# Patient Record
Sex: Female | Born: 1970 | Hispanic: No | Marital: Married | Smoking: Never smoker
Health system: Southern US, Community
[De-identification: ages and names within clinical notes are randomized; demographics above are authoritative.]

## PROBLEM LIST (undated history)

## (undated) DIAGNOSIS — Z9889 Other specified postprocedural states: Secondary | ICD-10-CM

## (undated) DIAGNOSIS — J45909 Unspecified asthma, uncomplicated: Secondary | ICD-10-CM

## (undated) DIAGNOSIS — R053 Chronic cough: Secondary | ICD-10-CM

## (undated) DIAGNOSIS — K219 Gastro-esophageal reflux disease without esophagitis: Secondary | ICD-10-CM

## (undated) DIAGNOSIS — N200 Calculus of kidney: Secondary | ICD-10-CM

## (undated) DIAGNOSIS — R05 Cough: Secondary | ICD-10-CM

## (undated) DIAGNOSIS — Z8719 Personal history of other diseases of the digestive system: Secondary | ICD-10-CM

## (undated) DIAGNOSIS — M797 Fibromyalgia: Secondary | ICD-10-CM

## (undated) DIAGNOSIS — D649 Anemia, unspecified: Secondary | ICD-10-CM

## (undated) HISTORY — PX: HERNIA REPAIR: SHX51

---

## 1998-08-13 ENCOUNTER — Other Ambulatory Visit: Admission: RE | Admit: 1998-08-13 | Discharge: 1998-08-13 | Payer: Self-pay | Admitting: Obstetrics & Gynecology

## 2000-01-10 ENCOUNTER — Inpatient Hospital Stay (HOSPITAL_COMMUNITY): Admission: AD | Admit: 2000-01-10 | Discharge: 2000-01-13 | Payer: Self-pay | Admitting: Obstetrics and Gynecology

## 2000-01-14 ENCOUNTER — Encounter: Admission: RE | Admit: 2000-01-14 | Discharge: 2000-04-13 | Payer: Self-pay | Admitting: Obstetrics & Gynecology

## 2000-02-10 ENCOUNTER — Other Ambulatory Visit: Admission: RE | Admit: 2000-02-10 | Discharge: 2000-02-10 | Payer: Self-pay | Admitting: Obstetrics and Gynecology

## 2002-03-15 ENCOUNTER — Other Ambulatory Visit: Admission: RE | Admit: 2002-03-15 | Discharge: 2002-03-15 | Payer: Self-pay | Admitting: Obstetrics & Gynecology

## 2002-06-05 ENCOUNTER — Inpatient Hospital Stay (HOSPITAL_COMMUNITY): Admission: AD | Admit: 2002-06-05 | Discharge: 2002-06-08 | Payer: Self-pay | Admitting: Obstetrics and Gynecology

## 2004-12-12 ENCOUNTER — Emergency Department (HOSPITAL_COMMUNITY): Admission: EM | Admit: 2004-12-12 | Discharge: 2004-12-12 | Payer: Self-pay | Admitting: Emergency Medicine

## 2004-12-15 ENCOUNTER — Other Ambulatory Visit: Admission: RE | Admit: 2004-12-15 | Discharge: 2004-12-15 | Payer: Self-pay | Admitting: Obstetrics & Gynecology

## 2009-06-16 ENCOUNTER — Encounter: Admission: RE | Admit: 2009-06-16 | Discharge: 2009-06-16 | Payer: Self-pay | Admitting: Obstetrics & Gynecology

## 2011-04-08 NOTE — Op Note (Signed)
[facility]  Patient:    Diamond Bennett, Diamond Bennett                    MRN: [id number] Proc. Date: 01/10/00 Adm. Date:  [id number] Attending:  Marcelle Overlie                           Operative Report  PREOPERATIVE DIAGNOSIS:       Intrauterine pregnancy at term.  Previous cesarean section, refuses vaginal birth after cesarean section.  POSTOPERATIVE DIAGNOSIS:      Intrauterine pregnancy at term.  Previous cesarean section, refuses vaginal birth after cesarean section.  OPERATION:                    Repeat low transverse cesarean section.  SURGEON:                      Marcelle Overlie, M.D.  ASSISTANT:                    Gerrit Friends. Aldona Bar, M.D.  ANESTHESIA:                   Spinal.  ESTIMATED BLOOD LOSS:         500 cc.  FINDINGS:                     Vigorous female in cephalic presentation with Apgars of 8 at one minute and 9 at five minutes weighing 8 pounds.  Normal adnexa.  DESCRIPTION OF PROCEDURE:     The patient was taken to the operating room.  A spinal was then placed without difficulty.  The abdomen and vulvar were then prepped and draped in the usual sterile fashion.  Foley catheter was placed in he bladder.  Using the scalpel, a low transverse incision was made and carried down to the fascia.  Using electrocautery with hemostasis the fascia was scored in the midline and extended laterally using Mayo scissors.  The Kocher clamps were then used to lift up on the fascia and a Pfannenstiel incision was created by dissecting the rectus muscles away superiorly and then inferiorly.  The midline was then separated.  The peritoneum was then entered bluntly and the peritoneal incision was extended superiorly and then inferiorly with good visualization of the bladder.  The bladder blade was inserted and the lower uterine segment was identified. The bladder flap was then created sharply and digitally.  The bladder blade was then readjusted.   A low transverse incision was made in the uterus and then extended  laterally.  The amniotic fluid was noted to be clear upon entry into the uterus. The infant was in cephalic presentation and was delivered via assistance with the vacuum extractor and was a vigorous female with Apgars of 8 at one minute and 9 at five minutes weighing 8 pounds.  The cord was clamped and cut.  The baby was handed to the awaiting pediatricians and taken to the newborn nursery.  Cord blood was  obtained.  The placenta was manually removed and noted to be intact.  The uterus was cleared of all clots and debris.  The uterine incision was closed in a single layer using 0 chromic in a continuous running locked stitch.  The uterine incision was noted to be hemostatic.  The peritoneum was closed using 0 Vicryl in a continuous running stitch and the rectus  muscles were reapproximated using the ame 0 Vicryl.  After irrigation of the rectus muscles and noting hemostasis, the fascia was closed using 0 Vicryl starting at each corner and meeting in the midline in a continuous running stitch.  After irrigation of the subcutaneous tissue, the skin was closed with staples.  All sponge, needle, and instrument counts were correct x 2.  The patient tolerated the procedure well and went to the recovery room in stable condition. DD:  01/10/00 TD:  01/10/00 Job: [id number]

## 2011-04-08 NOTE — Op Note (Signed)
[facility]  Patient:    Diamond Bennett, Diamond Bennett Visit Number: [id number] MRN: [id number]          Service Type: OBS Location: [facility] Attending Physician:  Mickle Mallory Dictated by:   Janeece Riggers Dareen Piano, M.D. Proc. Date: 06/05/02                             Operative Report  PREOPERATIVE DIAGNOSES:       1. Intrauterine pregnancy at term.                               2. History of prior cesarean section x2.                               3. The patient refuses attempt at vaginal birth                                  after cesarean section.                               4. Small umbilical hernia.  POSTOPERATIVE DIAGNOSES:      1. Intrauterine pregnancy at term.                               2. History of prior cesarean section x2.                               3. The patient refuses attempt at vaginal birth                                  after cesarean section.                               4. Small umbilical hernia.  PROCEDURE:                    1. Repeat low transverse cesarean section.                               2. Repair of umbilical hernia.  SURGEON:                      Mark E. Dareen Piano, M.D.  ANESTHESIA:                   Spinal.  ANTIBIOTICS:                  Ancef 1 g.  DRAINS:                       Foley to bed side drainage.  ESTIMATED BLOOD LOSS:         900 cc.  SPECIMENS:                    None.  COMPLICATIONS:  None.  FINDINGS:                     The patient had normal appearing fallopian tubes and ovaries.  Placenta appeared to be normal.  Uterine cavity was normal.  The uterus was normal.  There was no evidence of adhesions or endometriosis.  The patient had a 1 cm umbilical hernia on the superior margin of the umbilicus.  PROCEDURE:                    The patient was taken to the operating room where spinal anesthetic was administered without complications.  She was then placed in the dorsal  supine position with a left lateral tilt.  The patient was prepped with Hibiclens and a Foley catheter was placed in her bladder. She was then draped in the usual fashion for this procedure.  A Pfannenstiel incision was made through the previous scar.  This was carried down to the fascia.  The fascia was entered in the midline, extended laterally with the Mayo scissors.  The rectus muscles were then dissected from the fascia with the Bovie.  Rectus muscles were divided in the midline and taken superiorly and inferiorly.  The parietoperitoneum was entered sharply and taken superiorly and inferiorly.  The bladder flap was taken down sharply.  A low transverse uterine incision was made in the midline and extended laterally with blunt dissection.  Amniotic sac was entered sharply.  Amniotic fluid was noted to be clear.  The infant was delivered in a vertex presentation.  On del of the head the oropharynx and nostrils were bulb suctioned.  The remaining infant was then delivered.  The cord was doubly clamped and cut and the infant handed to the NICU team.  Placenta was then manually removed.  The uterus exteriorized.  The uterine cavity was wiped with a wet lap.  The uterine incision was closed in a single layer of 0 chromic in a running locking fashion.  Bladder flap was not repaired.  The uterus was placed back in the abdominal cavity and the incision again checked and felt to be hemostatic. Next, the parietoperitoneum on the anterior abdominal wall was dissected and the umbilical hernia discovered.  Two interrupted 0 Monocryl sutures were placed in this area closing the defect.  At this point parietoperitoneum and rectus muscles were reapproximated in the midline using 3-0 chromic.  Fascia was closed using 0 Monocryl suture in a running fashion.  Subcuticular tissue was freed of scar tissue with the Bovie and made hemostatic with the Bovie. Stainless steel clips were used to close the skin.   The patient tolerated the procedure well.  She was taken to the recovery room in stable condition. Instrument and lap counts were correct x2. Dictated by:   Janeece Riggers Dareen Piano, M.D. Attending Physician:  Mickle Mallory DD:  06/05/02 TD:  06/07/02 Job: [id number]

## 2011-04-08 NOTE — Discharge Summary (Signed)
[facility]  Patient:    Diamond Bennett, Diamond Bennett Visit Number: [id number] MRN: [id number]          Service Type: OBS Location: 910A 9130 01 Attending Physician:  Osborn Coho Dictated by:   Caralyn Guile Arlyce Dice, M.D. Admit Date:  06/05/2002 Discharge Date: 06/08/2002                             Discharge Summary  DISCHARGE DIAGNOSES:          1. Term pregnancy, delivered.                               2. Previous cesarean section.                               3. Umbilical hernia.  SECONDARY DIAGNOSES:          Repeat low transverse cesarean section and repair of umbilical hernia.  COMPLICATIONS:                None.  CONDITION:                    Condition on discharge was improved.  HISTORY OF PRESENT ILLNESS:   This is a 40 year old gravida 3, para 2, with an estimated date for confinement of June 14, 2002, who was admitted at 38-1/2 weeks for a repeat cesarean section and repair of umbilical hernia.  HOSPITAL COURSE:              The patient was brought to the operating room by Dr. Dareen Piano on the day of admission where a repeat low transverse cesarean section and repair of umbilical hernia was performed without complications. Operative course was benign without significant fever or anemia.  The patient delivered a female infant, 7 pounds 5 ounces.  Apgar scores were 9 and 9. There were no complications.  The patients postoperative course was benign without significant fever or anemia.  On the third postoperative day, the patient was felt to be ready for discharge.  She was discharged on a regular diet, told to limit her activity.  She was given Tylox 30 tablets to take one to two every four hours for pain.  She was asked to take her prenatal vitamins while she was nursing and to return to the office in four weeks for followup evaluation.  LABORATORY DATA:              Her admission hemoglobin was 9.4 with a white count of 6300.   Postoperatively, her hemoglobin was 8.2 with a white count of 7400.  Urinalysis was benign. Dictated by:   Caralyn Guile Arlyce Dice, M.D. Attending Physician:  Osborn Coho DD:  06/08/02 TD:  06/14/02 Job: [id number]

## 2011-04-08 NOTE — Discharge Summary (Signed)
[facility]  Patient:    Diamond Bennett, Diamond Bennett                    MRN: [id number] Adm. Date:  [id number] Disc. Date: [id number] Attending:  Marcelle Overlie Dictator:   Leilani Able, P.A.                           Discharge Summary  FINAL DIAGNOSES:              1. Intrauterine pregnancy at term.                               2. Previous cesarean section and refuses vaginal                                  birth after cesarean section.  PROCEDURE:                    Repeat low transverse cesarean section.  SURGEON:                      Dr. Marcelle Overlie.  ASSISTANT:                    Dr. Melvia Heaps.  COMPLICATIONS:                None.  HOSPITAL COURSE:              This G2 P1 was taken to the operating room on January 10, 2000 by Dr. Marcelle Overlie for a repeat low transverse cesarean section.  Patient had the delivery of an 8 pound female infant with Apgars of 8  and 9.  Delivery without complications.  Patients course was benign without significant fevers.  Patient was felt ready for discharge on postoperative day 3.  DIET:                         Patient was sent home on a regular diet.  ACTIVITY:                     Told to decrease activities.  MEDICATIONS:                  Told to continue prenatal vitamins, was given Tylox 1-2 q.4h. as needed for pain.  FOLLOW-UP:                    Was told to follow up in the office in four weeks. DD:  01/26/00 TD:  01/27/00 Job: [id number]

## 2014-08-17 DIAGNOSIS — M533 Sacrococcygeal disorders, not elsewhere classified: Secondary | ICD-10-CM | POA: Insufficient documentation

## 2014-08-17 DIAGNOSIS — D649 Anemia, unspecified: Secondary | ICD-10-CM | POA: Insufficient documentation

## 2014-08-17 DIAGNOSIS — M791 Myalgia, unspecified site: Secondary | ICD-10-CM | POA: Insufficient documentation

## 2014-08-17 DIAGNOSIS — D509 Iron deficiency anemia, unspecified: Secondary | ICD-10-CM | POA: Insufficient documentation

## 2014-08-17 DIAGNOSIS — N200 Calculus of kidney: Secondary | ICD-10-CM | POA: Insufficient documentation

## 2014-08-17 DIAGNOSIS — N644 Mastodynia: Secondary | ICD-10-CM | POA: Insufficient documentation

## 2014-08-17 DIAGNOSIS — M545 Low back pain, unspecified: Secondary | ICD-10-CM | POA: Insufficient documentation

## 2014-08-17 DIAGNOSIS — J45909 Unspecified asthma, uncomplicated: Secondary | ICD-10-CM | POA: Insufficient documentation

## 2015-01-13 ENCOUNTER — Other Ambulatory Visit: Payer: Self-pay | Admitting: Obstetrics & Gynecology

## 2015-01-13 DIAGNOSIS — R102 Pelvic and perineal pain: Secondary | ICD-10-CM

## 2015-01-13 DIAGNOSIS — G8929 Other chronic pain: Secondary | ICD-10-CM

## 2015-01-13 DIAGNOSIS — R109 Unspecified abdominal pain: Secondary | ICD-10-CM

## 2015-01-20 ENCOUNTER — Ambulatory Visit
Admission: RE | Admit: 2015-01-20 | Discharge: 2015-01-20 | Disposition: A | Discharge status: Home / Self Care | Payer: BLUE CROSS/BLUE SHIELD | Source: Ambulatory Visit | Attending: Obstetrics & Gynecology | Admitting: Obstetrics & Gynecology

## 2015-01-20 DIAGNOSIS — G8929 Other chronic pain: Secondary | ICD-10-CM

## 2015-01-20 DIAGNOSIS — R102 Pelvic and perineal pain: Secondary | ICD-10-CM

## 2015-01-20 DIAGNOSIS — R109 Unspecified abdominal pain: Secondary | ICD-10-CM

## 2015-01-20 MED ORDER — IOHEXOL 300 MG/ML  SOLN
100.0000 mL | Freq: Once | INTRAMUSCULAR | Status: AC | PRN
  Administered 2015-01-20: 100 mL via INTRAVENOUS

## 2016-03-21 ENCOUNTER — Other Ambulatory Visit: Payer: Self-pay | Admitting: Obstetrics & Gynecology

## 2016-03-21 DIAGNOSIS — R928 Other abnormal and inconclusive findings on diagnostic imaging of breast: Secondary | ICD-10-CM

## 2016-03-29 ENCOUNTER — Ambulatory Visit
Admission: RE | Admit: 2016-03-29 | Discharge: 2016-03-29 | Disposition: A | Discharge status: Home / Self Care | Payer: BLUE CROSS/BLUE SHIELD | Source: Ambulatory Visit | Attending: Obstetrics & Gynecology | Admitting: Obstetrics & Gynecology

## 2016-03-29 ENCOUNTER — Other Ambulatory Visit: Payer: No Typology Code available for payment source

## 2016-03-29 DIAGNOSIS — R928 Other abnormal and inconclusive findings on diagnostic imaging of breast: Secondary | ICD-10-CM

## 2016-03-31 DIAGNOSIS — J310 Chronic rhinitis: Secondary | ICD-10-CM | POA: Insufficient documentation

## 2016-03-31 DIAGNOSIS — R05 Cough: Secondary | ICD-10-CM | POA: Insufficient documentation

## 2016-03-31 DIAGNOSIS — J40 Bronchitis, not specified as acute or chronic: Secondary | ICD-10-CM | POA: Insufficient documentation

## 2016-03-31 DIAGNOSIS — R059 Cough, unspecified: Secondary | ICD-10-CM | POA: Insufficient documentation

## 2016-04-12 ENCOUNTER — Encounter: Payer: Self-pay | Admitting: Family

## 2016-04-12 ENCOUNTER — Ambulatory Visit: Payer: BLUE CROSS/BLUE SHIELD

## 2016-04-12 ENCOUNTER — Ambulatory Visit (HOSPITAL_BASED_OUTPATIENT_CLINIC_OR_DEPARTMENT_OTHER): Payer: BLUE CROSS/BLUE SHIELD

## 2016-04-12 ENCOUNTER — Other Ambulatory Visit (HOSPITAL_BASED_OUTPATIENT_CLINIC_OR_DEPARTMENT_OTHER): Payer: BLUE CROSS/BLUE SHIELD

## 2016-04-12 ENCOUNTER — Other Ambulatory Visit: Payer: Self-pay | Admitting: Family

## 2016-04-12 ENCOUNTER — Ambulatory Visit (HOSPITAL_BASED_OUTPATIENT_CLINIC_OR_DEPARTMENT_OTHER): Payer: BLUE CROSS/BLUE SHIELD | Admitting: Family

## 2016-04-12 VITALS — BP 127/77 | HR 93 | Temp 97.6°F | Resp 16 | Ht 64.0 in | Wt 162.0 lb

## 2016-04-12 VITALS — BP 108/68 | HR 99 | Temp 98.2°F | Resp 18

## 2016-04-12 DIAGNOSIS — D508 Other iron deficiency anemias: Secondary | ICD-10-CM

## 2016-04-12 DIAGNOSIS — D649 Anemia, unspecified: Secondary | ICD-10-CM

## 2016-04-12 DIAGNOSIS — N921 Excessive and frequent menstruation with irregular cycle: Secondary | ICD-10-CM

## 2016-04-12 DIAGNOSIS — D509 Iron deficiency anemia, unspecified: Secondary | ICD-10-CM

## 2016-04-12 LAB — COMPREHENSIVE METABOLIC PANEL
ANION GAP: 9 meq/L (ref 3–11)
AST: 10 U/L (ref 5–34)
Albumin: 3.7 g/dL (ref 3.5–5.0)
Alkaline Phosphatase: 81 U/L (ref 40–150)
BILIRUBIN TOTAL: 0.48 mg/dL (ref 0.20–1.20)
BUN: 12.2 mg/dL (ref 7.0–26.0)
CALCIUM: 9.5 mg/dL (ref 8.4–10.4)
CHLORIDE: 105 meq/L (ref 98–109)
CO2: 26 mEq/L (ref 22–29)
CREATININE: 0.7 mg/dL (ref 0.6–1.1)
EGFR: >90 mL/min/{1.73_m2} (ref >90)
Glucose: 100 mg/dl (ref 70–140)
Potassium: 4 mEq/L (ref 3.5–5.1)
Sodium: 140 mEq/L (ref 136–145)
TOTAL PROTEIN: 7.1 g/dL (ref 6.4–8.3)

## 2016-04-12 LAB — CBC WITH DIFFERENTIAL (CANCER CENTER ONLY)
BASO#: 0 10*3/uL (ref 0.0–0.2)
BASO%: 0.1 % (ref 0.0–2.0)
EOS%: 1.4 % (ref 0.0–7.0)
Eosinophils Absolute: 0.2 10*3/uL (ref 0.0–0.5)
HEMATOCRIT: 32.1 % — AB (ref 34.8–46.6)
HEMOGLOBIN: 9.6 g/dL — AB (ref 11.6–15.9)
LYMPH#: 1.7 10*3/uL (ref 0.9–3.3)
LYMPH%: 13.5 % — ABNORMAL LOW (ref 14.0–48.0)
MCH: 19.8 pg — ABNORMAL LOW (ref 26.0–34.0)
MCHC: 29.9 g/dL — ABNORMAL LOW (ref 32.0–36.0)
MCV: 66 fL — AB (ref 81–101)
MONO#: 0.5 10*3/uL (ref 0.1–0.9)
MONO%: 4 % (ref 0.0–13.0)
NEUT%: 81 % — ABNORMAL HIGH (ref 39.6–80.0)
NEUTROS ABS: 9.9 10*3/uL — AB (ref 1.5–6.5)
Platelets: 338 10*3/uL (ref 145–400)
RBC: 4.84 10*6/uL (ref 3.70–5.32)
RDW: 22.3 % — ABNORMAL HIGH (ref 11.1–15.7)
WBC: 12.2 10*3/uL — AB (ref 3.9–10.0)

## 2016-04-12 LAB — CHCC SATELLITE - SMEAR

## 2016-04-12 MED ORDER — FOLIC ACID 1 MG PO TABS: 1.0000 mg | ORAL_TABLET | Freq: Every day | ORAL | Status: AC

## 2016-04-12 MED ORDER — SODIUM CHLORIDE 0.9 % IV SOLN
Freq: Once | INTRAVENOUS | Status: AC
Start: 2016-04-12 — End: 2016-04-12
  Administered 2016-04-12: 12:00:00 via INTRAVENOUS

## 2016-04-12 MED ORDER — SODIUM CHLORIDE 0.9 % IV SOLN
510.0000 mg | Freq: Once | INTRAVENOUS | Status: AC
  Administered 2016-04-12: 510 mg via INTRAVENOUS
  Filled 2016-04-12: qty 17

## 2016-04-12 NOTE — Patient Instructions (Signed)

## 2016-04-12 NOTE — Progress Notes (Signed)
Hematology/Oncology Consultation   Name: Diamond RobertsDenise J Bennett      MRN: [id number]    Location: Room/bed info not found  Date: 04/12/2016 Time:10:22 AM   REFERRING PHYSICIAN: Annamaria Hellingobert Wein, MD  REASON FOR CONSULT: Anemia   DIAGNOSIS: Iron deficiency anemia secondary to menorrhagia   HISTORY OF PRESENT ILLNESS: Diamond Bennett is a very pleasant 45 yo female with history of iron deficiency anemia secondary to menorrhagia. She was previously on an oral iron supplement but stopped this over a month ago because she felt it was not helping. She is symptomatic with fatigued and chewing ice at this time.  Her cycles are heavy for the first 3-4 days and typically last 7 days.  She states that she has uterine fibroids and will be discussing having them removed vs. hysterectomy with her gynecologist at her next appointment.   Her mother also has a history of anemia and takes an oral supplement daily.  She has had issues with a productive cough (green sputum) and congestion for a month or so. She has had some mild SOB with this at times. She states that her PCP treated her with a z-pack and prednisone taper but she is still having symptoms. She states that she is scheduled for scans next week.  No personal or familial history of cancer.  No fever, chills, n/v, rash, dizziness, chest pain, palpitations, abdominal pain or changes in bowel or bladder habits.  No swelling, tenderness, numbness or tingling in her extremities. No c/o joint pain.  She has maintained a good appetite and is staying well hydrated. Her weight is stable.  She has 3 healthy children all delivered by c-section. No miscarriages.  She stays busy with work and enjoys walking for exercise.    ROS: All other 10 point review of systems is negative.   PAST MEDICAL HISTORY:   No past medical history on file.  ALLERGIES: Allergies not on file    MEDICATIONS:  No current outpatient prescriptions on file prior to visit.   No current  facility-administered medications on file prior to visit.     PAST SURGICAL HISTORY No past surgical history on file.  FAMILY HISTORY: No family history on file.  SOCIAL HISTORY:  has no tobacco, alcohol, and drug history on file.  PERFORMANCE STATUS: The patient's performance status is 1 - Symptomatic but completely ambulatory  PHYSICAL EXAM: Most Recent Vital Signs: There were no vitals taken for this visit. There were no vitals taken for this visit.  General Appearance:    Alert, cooperative, no distress, appears stated age  Head:    Normocephalic, without obvious abnormality, atraumatic  Eyes:    PERRL, conjunctiva/corneas clear, EOM's intact, fundi    benign, both eyes        Throat:   Lips, mucosa, and tongue normal; teeth and gums normal  Neck:   Supple, symmetrical, trachea midline, no adenopathy;    thyroid:  no enlargement/tenderness/nodules; no carotid   bruit or JVD  Back:     Symmetric, no curvature, ROM normal, no CVA tenderness  Lungs:     Clear to auscultation bilaterally, respirations unlabored  Chest Wall:    No tenderness or deformity   Heart:    Regular rate and rhythm, S1 and S2 normal, no murmur, rub   or gallop     Abdomen:     Soft, non-tender, bowel sounds active all four quadrants,    no masses, no organomegaly        Extremities:  Extremities normal, atraumatic, no cyanosis or edema  Pulses:   2+ and symmetric all extremities  Skin:   Skin color, texture, turgor normal, no rashes or lesions  Lymph nodes:   Cervical, supraclavicular, and axillary nodes normal  Neurologic:   CNII-XII intact, normal strength, sensation and reflexes    throughout    LABORATORY DATA:  No results found for this or any previous visit (from the past 48 hour(s)).    RADIOGRAPHY: No results found.     PATHOLOGY: None  ASSESSMENT/PLAN: Diamond Bennett is a very pleasant 45 yo female with history of iron deficiency anemia secondary to menorrhagia and failed oral iron  supplement. She is symptomatic with fatigue and chewing ice.  She is followed closely by gynecology and will be discussing having her uterine fibroids removed vs. hysterectomy at her next appointment.  Her Hgb is 9.6 with an MCV of 66. We will go ahead and give her a dose of Feraheme today while she is here. We will see what her iron studies show and bring her in for a second dose in 8 days if needed.  She will start taking folic acid 1 mg PO daily.   We will plan to see her back in 6 weeks for repeat lab work and follow-up.  All questions were answered. She will contact our office with any questions or concerns. We can certainly see Her much sooner if necessary.  She was discussed with and also seen by Dr. Myna Hidalgo and he is in agreement with the aforementioned.   [facility]     Addendum:  I saw and examined the patient with Diamond Bennett. We looked at her blood smear. It looks like her blood smear is consistent with iron deficiency. She has blood loss secondary to uterine fibroids. Hopefully she will have these taken care of.  On her exam, I cannot find anything unusual. There is no hepatomegaly or splenomegaly. She had no palpable lymphadenopathy. No rashes noted on her skin exam. She has no oral lesions.  We will go ahead and give her some IV iron. I think that this will definitely work.  We will plan to see her back in 6 weeks.  We spent about 40 minutes with she and her daughter. They're both very nice.  Of note, is the fact that she is [place] so this might increase the chance of her having a hemoglobinopathy.  Diamond Bennett

## 2016-04-13 ENCOUNTER — Telehealth: Payer: Self-pay | Admitting: *Deleted

## 2016-04-13 LAB — ERYTHROPOIETIN: ERYTHROPOIETIN: 62 m[IU]/mL — AB (ref 2.6–18.5)

## 2016-04-13 LAB — IRON AND TIBC
%SAT: 8 % — AB (ref 21–57)
IRON: 27 ug/dL — AB (ref 41–142)
TIBC: 325 ug/dL (ref 236–444)
UIBC: 298 ug/dL (ref 120–384)

## 2016-04-13 LAB — RETICULOCYTES: Reticulocyte Count: 1.3 % (ref 0.6–2.6)

## 2016-04-13 LAB — FERRITIN: Ferritin: 12 ng/ml (ref 9–269)

## 2016-04-13 NOTE — Telephone Encounter (Addendum)
Patient aware of results. Appointment made.   ----- Message from Verdie MosherSarah M [place], NP sent at 04/13/2016 10:21 AM EDT ----- Regarding: Iron  Iron is quite low. She will need a second dose of Feraheme in 8 days please. Thank you!!!   Sarah  ----- Message -----    From: [facility] Interface    Sent: 04/12/2016  10:34 AM      To: Verdie MosherSarah M [place], NP

## 2016-04-14 LAB — HEMOGLOBINOPATHY EVALUATION
HEMOGLOBIN A2 QUANTITATION: 1.7 % (ref 0.7–3.1)
HEMOGLOBIN F QUANTITATION: 0 % (ref 0.0–2.0)
HGB A: 98.3 % — AB (ref 94.0–98.0)
HGB C: 0 %
HGB S: 0 %

## 2016-04-20 ENCOUNTER — Ambulatory Visit: Payer: BLUE CROSS/BLUE SHIELD

## 2016-04-21 LAB — ALPHA-THALASSEMIA GENOTYPR: PDF: 0

## 2016-04-22 ENCOUNTER — Ambulatory Visit (HOSPITAL_BASED_OUTPATIENT_CLINIC_OR_DEPARTMENT_OTHER): Payer: BLUE CROSS/BLUE SHIELD

## 2016-04-22 VITALS — BP 116/79 | HR 84 | Temp 98.0°F | Resp 18

## 2016-04-22 DIAGNOSIS — N921 Excessive and frequent menstruation with irregular cycle: Secondary | ICD-10-CM

## 2016-04-22 DIAGNOSIS — D508 Other iron deficiency anemias: Secondary | ICD-10-CM

## 2016-04-22 DIAGNOSIS — D509 Iron deficiency anemia, unspecified: Secondary | ICD-10-CM

## 2016-04-22 MED ORDER — SODIUM CHLORIDE 0.9 % IV SOLN
510.0000 mg | Freq: Once | INTRAVENOUS | Status: AC
  Administered 2016-04-22: 510 mg via INTRAVENOUS
  Filled 2016-04-22: qty 17

## 2016-04-22 MED ORDER — SODIUM CHLORIDE 0.9 % IV SOLN
Freq: Once | INTRAVENOUS | Status: AC
  Administered 2016-04-22: 12:00:00 via INTRAVENOUS

## 2016-04-22 NOTE — Patient Instructions (Signed)

## 2016-05-17 ENCOUNTER — Other Ambulatory Visit: Payer: Self-pay | Admitting: Obstetrics & Gynecology

## 2016-05-25 ENCOUNTER — Other Ambulatory Visit (HOSPITAL_BASED_OUTPATIENT_CLINIC_OR_DEPARTMENT_OTHER): Payer: BLUE CROSS/BLUE SHIELD

## 2016-05-25 ENCOUNTER — Ambulatory Visit (HOSPITAL_BASED_OUTPATIENT_CLINIC_OR_DEPARTMENT_OTHER): Payer: BLUE CROSS/BLUE SHIELD | Admitting: Family

## 2016-05-25 VITALS — BP 117/73 | HR 87 | Temp 97.5°F | Resp 16 | Wt 163.4 lb

## 2016-05-25 DIAGNOSIS — N920 Excessive and frequent menstruation with regular cycle: Secondary | ICD-10-CM | POA: Diagnosis not present

## 2016-05-25 DIAGNOSIS — D509 Iron deficiency anemia, unspecified: Secondary | ICD-10-CM

## 2016-05-25 DIAGNOSIS — D500 Iron deficiency anemia secondary to blood loss (chronic): Secondary | ICD-10-CM

## 2016-05-25 LAB — CBC WITH DIFFERENTIAL (CANCER CENTER ONLY)
BASO#: 0 10*3/uL (ref 0.0–0.2)
BASO%: 0.2 % (ref 0.0–2.0)
EOS%: 1 % (ref 0.0–7.0)
Eosinophils Absolute: 0.1 10*3/uL (ref 0.0–0.5)
HCT: 39.5 % (ref 34.8–46.6)
HGB: 12.7 g/dL (ref 11.6–15.9)
LYMPH#: 1.5 10*3/uL (ref 0.9–3.3)
LYMPH%: 24.5 % (ref 14.0–48.0)
MCH: 25.5 pg — AB (ref 26.0–34.0)
MCHC: 32.2 g/dL (ref 32.0–36.0)
MCV: 79 fL — AB (ref 81–101)
MONO#: 0.3 10*3/uL (ref 0.1–0.9)
MONO%: 5.2 % (ref 0.0–13.0)
NEUT#: 4.2 10*3/uL (ref 1.5–6.5)
NEUT%: 69.1 % (ref 39.6–80.0)
RBC: 4.99 10*6/uL (ref 3.70–5.32)
RDW: 23.1 % — AB (ref 11.1–15.7)
WBC: 6 10*3/uL (ref 3.9–10.0)

## 2016-05-25 LAB — IRON AND TIBC
%SAT: 15 % — ABNORMAL LOW (ref 21–57)
Iron: 50 ug/dL (ref 41–142)
TIBC: 329 ug/dL (ref 236–444)
UIBC: 279 ug/dL (ref 120–384)

## 2016-05-25 LAB — FERRITIN: Ferritin: 76 ng/ml (ref 9–269)

## 2016-05-25 NOTE — Progress Notes (Signed)
Hematology and Oncology Follow Up Visit  Diamond Bennett [id number] 11-Nov-1971 45 y.o. 05/25/2016   Principle Diagnosis:  Iron deficiency anemia secondary to menorrhagia   Current Therapy:   IV iron as indicated     Interim History:  [name] is here today for a follow-up. She is feeling much better since receiving 2 doses of Feraheme at the end of May. Her symptoms have resolved and she has no complaints at this time. Her Hgb is now 12.7 with an MCV of 79.  She is now on birth control and her cycle is much lighter. She is scheduled to have a hysterectomy in 3 weeks.  No fever, chills, n/v, cough, rash, ice cravings, dizziness, SOB, chest pain, palpitations, abdominal pain or changes in bowel or bladder habits.  No swelling, tenderness, numbness or tingling in her extremities. No new aches or pains.  She has maintained a good appetite and is staying well hydrated. Her weight is stable.   Medications:    Medication List       This list is accurate as of: 05/25/16 11:23 AM.  Always use your most recent med list.               ALAYCEN 1/35 tablet  Generic drug:  norethindrone-ethinyl estradiol 1/35  Take 1 tablet by mouth daily.     ALPRAZolam 0.5 MG tablet  Commonly known as:  XANAX  Take 0.5 mg by mouth at bedtime as needed for anxiety or sleep.     DULERA 100-5 MCG/ACT Aero  Generic drug:  mometasone-formoterol  Inhale 2 puffs into the lungs 2 (two) times daily.     folic acid 1 MG tablet  Commonly known as:  FOLVITE  Take 1 tablet (1 mg total) by mouth daily.     montelukast 10 MG tablet  Commonly known as:  SINGULAIR  Take 10 mg by mouth at bedtime.     omeprazole 20 MG capsule  Commonly known as:  PRILOSEC  Take 20 mg by mouth 2 (two) times daily before a meal.        Allergies:  Allergies  Allergen Reactions  . Morphine And Related Itching    Past Medical History, Surgical history, Social history, and Family History were reviewed and  updated.  Review of Systems: All other 10 point review of systems is negative.   Physical Exam:  weight is 163 lb 6.4 oz (74.118 kg). Her oral temperature is 97.5 F (36.4 C). Her blood pressure is 117/73 and her pulse is 87. Her respiration is 16.   Wt Readings from Last 3 Encounters:  05/25/16 163 lb 6.4 oz (74.118 kg)  04/12/16 162 lb (73.483 kg)    Ocular: Sclerae unicteric, pupils equal, round and reactive to light Ear-nose-throat: Oropharynx clear, dentition fair Lymphatic: No cervical supraclavicular or axillary adenopathy Lungs no rales or rhonchi, good excursion bilaterally Heart regular rate and rhythm, no murmur appreciated Abd soft, nontender, positive bowel sounds, no liver or spleen tip palpated on exam, no fluid wave MSK no focal spinal tenderness, no joint edema Neuro: non-focal, well-oriented, appropriate affect Breasts: Defererd  Lab Results  Component Value Date   WBC 12.2* 04/12/2016   HGB 9.6* 04/12/2016   HCT 32.1* 04/12/2016   MCV 66* 04/12/2016   PLT 338 04/12/2016   Lab Results  Component Value Date   FERRITIN 12 04/12/2016   IRON 27* 04/12/2016   TIBC 325 04/12/2016   UIBC 298 04/12/2016   IRONPCTSAT 8* 04/12/2016  Lab Results  Component Value Date   RBC 4.84 04/12/2016   No results found for: KPAFRELGTCHN, LAMBDASER, KAPLAMBRATIO No results found for: IGGSERUM, IGA, IGMSERUM No results found for: Marda StalkerOTALPROTELP, ALBUMINELP, A1GS, A2GS, BETS, BETA2SER, GAMS, MSPIKE, SPEI   Chemistry      Component Value Date/Time   NA 140 04/12/2016 1008   K 4.0 04/12/2016 1008   CO2 26 04/12/2016 1008   BUN 12.2 04/12/2016 1008   CREATININE 0.7 04/12/2016 1008      Component Value Date/Time   CALCIUM 9.5 04/12/2016 1008   ALKPHOS 81 04/12/2016 1008   AST 10 04/12/2016 1008   ALT <9 04/12/2016 1008   BILITOT 0.48 04/12/2016 1008     Impression and Plan: [name] is a very pleasant 45 yo female with history of iron deficiency anemia secondary  to menorrhagia. She had a nice response to the two doses of Feraheme she received in May/June. She is asymptomatic at this time.  She is scheduled to have a hysterectomy in 3 weeks. Her Hgb is now up to 12.7. We will see what her iron studies show and give her a dose of Feraheme a week or so before surgery if needed.  We will plan to see her back in 3 months for repeat labs and follow-up.  She will contact us with any questions or concerns. We can certainly see her sooner if need be.    Verdie Mosher[place],Ralyn Stlaurent M, NP 7/5/201711:23 AM

## 2016-05-26 ENCOUNTER — Inpatient Hospital Stay (HOSPITAL_COMMUNITY)
Admission: RE | Admit: 2016-05-26 | Discharge: 2016-05-26 | Disposition: A | Discharge status: Home / Self Care | Payer: BLUE CROSS/BLUE SHIELD | Source: Ambulatory Visit

## 2016-05-27 ENCOUNTER — Telehealth: Payer: Self-pay | Admitting: Nurse Practitioner

## 2016-05-27 NOTE — Telephone Encounter (Addendum)
Pt verbalized understanding and has been scheduled. ----- Message from Verdie MosherSarah M [place], NP sent at 05/25/2016  3:00 PM EDT ----- Regarding: Iron Iron saturation still low. She will need one dose of Feraheme scheduled for this week or next please. Thank you!  Sarah  ----- Message -----    From: [facility] Interface    Sent: 05/25/2016  11:32 AM      To: Verdie MosherSarah M [place], NP

## 2016-06-01 ENCOUNTER — Ambulatory Visit (HOSPITAL_BASED_OUTPATIENT_CLINIC_OR_DEPARTMENT_OTHER): Payer: BLUE CROSS/BLUE SHIELD

## 2016-06-01 VITALS — BP 112/68 | HR 98 | Resp 20

## 2016-06-01 DIAGNOSIS — D509 Iron deficiency anemia, unspecified: Secondary | ICD-10-CM

## 2016-06-01 DIAGNOSIS — N920 Excessive and frequent menstruation with regular cycle: Secondary | ICD-10-CM

## 2016-06-01 DIAGNOSIS — D500 Iron deficiency anemia secondary to blood loss (chronic): Secondary | ICD-10-CM | POA: Diagnosis not present

## 2016-06-01 MED ORDER — SODIUM CHLORIDE 0.9 % IV SOLN
510.0000 mg | Freq: Once | INTRAVENOUS | Status: AC
  Administered 2016-06-01: 510 mg via INTRAVENOUS
  Filled 2016-06-01: qty 17

## 2016-06-01 MED ORDER — SODIUM CHLORIDE 0.9 % IV SOLN
Freq: Once | INTRAVENOUS | Status: AC
  Administered 2016-06-01: 16:00:00 via INTRAVENOUS

## 2016-06-08 NOTE — Patient Instructions (Signed)
Your procedure is scheduled on:  Tuesday, June 21, 2016  [facility]  8:00 AM  Pick up the phone at the desk and dial [phone].  Call this number if you have problems the morning of surgery: [phone].  Remember: Do NOT eat food or drink after:  Midnight Monday, June 20, 2016  Take these medicines the morning of surgery with a SIP OF WATER:  Omeprazole  Bring Asthma Inhaler day of surgery  Do NOT wear jewelry (body piercing), metal hair clips/bobby pins, make-up, or nail polish. Do NOT wear lotions, powders, or perfumes.  You may wear deodorant. Do NOT shave for 48 hours prior to surgery. Do NOT bring valuables to the hospital. Contacts, dentures, or bridgework may not be worn into surgery.  Leave suitcase in car.  After surgery it may be brought to your room.  For patients admitted to the hospital, checkout time is 11:00 AM the day of discharge.

## 2016-06-09 ENCOUNTER — Encounter (HOSPITAL_COMMUNITY): Payer: Self-pay

## 2016-06-09 ENCOUNTER — Encounter (HOSPITAL_COMMUNITY)
Admission: RE | Admit: 2016-06-09 | Discharge: 2016-06-09 | Disposition: A | Discharge status: Home / Self Care | Payer: BLUE CROSS/BLUE SHIELD | Source: Ambulatory Visit | Attending: Obstetrics & Gynecology | Admitting: Obstetrics & Gynecology

## 2016-06-09 DIAGNOSIS — Z029 Encounter for administrative examinations, unspecified: Secondary | ICD-10-CM | POA: Diagnosis present

## 2016-06-09 HISTORY — DX: Other specified postprocedural states: Z98.890

## 2016-06-09 HISTORY — DX: Personal history of other diseases of the digestive system: Z87.19

## 2016-06-09 HISTORY — DX: Gastro-esophageal reflux disease without esophagitis: K21.9

## 2016-06-09 HISTORY — DX: Cough: R05

## 2016-06-09 HISTORY — DX: Fibromyalgia: M79.7

## 2016-06-09 HISTORY — DX: Unspecified asthma, uncomplicated: J45.909

## 2016-06-09 HISTORY — DX: Calculus of kidney: N20.0

## 2016-06-09 HISTORY — DX: Anemia, unspecified: D64.9

## 2016-06-09 HISTORY — DX: Chronic cough: R05.3

## 2016-06-09 LAB — TYPE AND SCREEN
ABO/RH(D): O POS
Antibody Screen: NEGATIVE

## 2016-06-09 LAB — CBC
HCT: 33.8 % — ABNORMAL LOW (ref 36.0–46.0)
Hemoglobin: 10.7 g/dL — ABNORMAL LOW (ref 12.0–15.0)
MCH: 26.2 pg (ref 26.0–34.0)
MCHC: 31.7 g/dL (ref 30.0–36.0)
MCV: 82.6 fL (ref 78.0–100.0)
PLATELETS: 251 10*3/uL (ref 150–400)
RBC: 4.09 MIL/uL (ref 3.87–5.11)
RDW: 22.7 % — AB (ref 11.5–15.5)
WBC: 10.8 10*3/uL — AB (ref 4.0–10.5)

## 2016-06-09 LAB — ABO/RH: ABO/RH(D): O POS

## 2016-06-21 ENCOUNTER — Observation Stay (HOSPITAL_COMMUNITY)
Admission: RE | Admit: 2016-06-21 | Discharge: 2016-06-22 | Disposition: A | Discharge status: Home / Self Care | Payer: BLUE CROSS/BLUE SHIELD | Source: Ambulatory Visit | Attending: Obstetrics & Gynecology | Admitting: Obstetrics & Gynecology

## 2016-06-21 ENCOUNTER — Ambulatory Visit (HOSPITAL_COMMUNITY): Payer: BLUE CROSS/BLUE SHIELD | Admitting: Anesthesiology

## 2016-06-21 ENCOUNTER — Encounter (HOSPITAL_COMMUNITY): Payer: Self-pay

## 2016-06-21 ENCOUNTER — Encounter (HOSPITAL_COMMUNITY)
Admission: RE | Disposition: A | Discharge status: Home / Self Care | Payer: Self-pay | Source: Ambulatory Visit | Attending: Obstetrics & Gynecology

## 2016-06-21 DIAGNOSIS — Z90710 Acquired absence of both cervix and uterus: Secondary | ICD-10-CM | POA: Diagnosis not present

## 2016-06-21 DIAGNOSIS — J45909 Unspecified asthma, uncomplicated: Secondary | ICD-10-CM | POA: Insufficient documentation

## 2016-06-21 DIAGNOSIS — N939 Abnormal uterine and vaginal bleeding, unspecified: Principal | ICD-10-CM | POA: Insufficient documentation

## 2016-06-21 HISTORY — PX: LAPAROSCOPIC HYSTERECTOMY: SHX1926

## 2016-06-21 HISTORY — PX: CYSTOSCOPY: SHX5120

## 2016-06-21 LAB — CBC
HCT: 33.3 % — ABNORMAL LOW (ref 36.0–46.0)
Hemoglobin: 10.7 g/dL — ABNORMAL LOW (ref 12.0–15.0)
MCH: 26.8 pg (ref 26.0–34.0)
MCHC: 32.1 g/dL (ref 30.0–36.0)
MCV: 83.3 fL (ref 78.0–100.0)
PLATELETS: 203 10*3/uL (ref 150–400)
RBC: 4 MIL/uL (ref 3.87–5.11)
RDW: 17.7 % — ABNORMAL HIGH (ref 11.5–15.5)
WBC: 5.6 10*3/uL (ref 4.0–10.5)

## 2016-06-21 LAB — PREGNANCY, URINE: Preg Test, Ur: NEGATIVE

## 2016-06-21 SURGERY — HYSTERECTOMY, TOTAL, LAPAROSCOPIC
Anesthesia: General | Site: Urethra

## 2016-06-21 MED ORDER — LACTATED RINGERS IR SOLN
Status: DC | PRN
  Administered 2016-06-21: 3000 mL

## 2016-06-21 MED ORDER — ONDANSETRON HCL 4 MG/2ML IJ SOLN
INTRAMUSCULAR | Status: DC | PRN
  Administered 2016-06-21: 4 mg via INTRAVENOUS

## 2016-06-21 MED ORDER — DEXAMETHASONE SODIUM PHOSPHATE 4 MG/ML IJ SOLN
INTRAMUSCULAR | Status: AC
  Filled 2016-06-21: qty 1

## 2016-06-21 MED ORDER — DIPHENHYDRAMINE HCL 12.5 MG/5ML PO ELIX
12.5000 mg | ORAL_SOLUTION | Freq: Four times a day (QID) | ORAL | Status: DC | PRN
  Filled 2016-06-21: qty 5

## 2016-06-21 MED ORDER — MEPERIDINE HCL 25 MG/ML IJ SOLN: 6.2500 mg | INTRAMUSCULAR | Status: DC | PRN

## 2016-06-21 MED ORDER — DEXAMETHASONE SODIUM PHOSPHATE 10 MG/ML IJ SOLN
INTRAMUSCULAR | Status: DC | PRN
  Administered 2016-06-21: 4 mg via INTRAVENOUS

## 2016-06-21 MED ORDER — SIMETHICONE 80 MG PO CHEW
80.0000 mg | CHEWABLE_TABLET | Freq: Four times a day (QID) | ORAL | Status: DC | PRN
  Administered 2016-06-21: 80 mg via ORAL
  Filled 2016-06-21: qty 1

## 2016-06-21 MED ORDER — FENTANYL CITRATE (PF) 100 MCG/2ML IJ SOLN
INTRAMUSCULAR | Status: AC
  Administered 2016-06-21: 25 ug via INTRAVENOUS
  Filled 2016-06-21: qty 2

## 2016-06-21 MED ORDER — KETOROLAC TROMETHAMINE 30 MG/ML IJ SOLN
30.0000 mg | Freq: Four times a day (QID) | INTRAMUSCULAR | Status: DC
  Administered 2016-06-21 – 2016-06-22 (×3): 30 mg via INTRAVENOUS
  Filled 2016-06-21 (×2): qty 1

## 2016-06-21 MED ORDER — PROPOFOL 10 MG/ML IV BOLUS
INTRAVENOUS | Status: DC | PRN
  Administered 2016-06-21: 150 mg via INTRAVENOUS

## 2016-06-21 MED ORDER — MIDAZOLAM HCL 2 MG/2ML IJ SOLN
INTRAMUSCULAR | Status: DC | PRN
  Administered 2016-06-21: 2 mg via INTRAVENOUS

## 2016-06-21 MED ORDER — GLYCOPYRROLATE 0.2 MG/ML IJ SOLN
INTRAMUSCULAR | Status: AC
  Filled 2016-06-21: qty 3

## 2016-06-21 MED ORDER — MENTHOL 3 MG MT LOZG: 1.0000 | LOZENGE | OROMUCOSAL | Status: DC | PRN

## 2016-06-21 MED ORDER — LIDOCAINE HCL (CARDIAC) 20 MG/ML IV SOLN
INTRAVENOUS | Status: AC
  Filled 2016-06-21: qty 5

## 2016-06-21 MED ORDER — SODIUM CHLORIDE 0.9% FLUSH: 9.0000 mL | INTRAVENOUS | Status: DC | PRN

## 2016-06-21 MED ORDER — PROPOFOL 10 MG/ML IV BOLUS
INTRAVENOUS | Status: AC
  Filled 2016-06-21: qty 20

## 2016-06-21 MED ORDER — CEFAZOLIN SODIUM-DEXTROSE 2-4 GM/100ML-% IV SOLN: 2.0000 g | Freq: Once | INTRAVENOUS | Status: DC

## 2016-06-21 MED ORDER — SCOPOLAMINE 1 MG/3DAYS TD PT72
MEDICATED_PATCH | TRANSDERMAL | Status: AC
  Administered 2016-06-21: 1.5 mg via TRANSDERMAL
  Filled 2016-06-21: qty 1

## 2016-06-21 MED ORDER — LIDOCAINE HCL (CARDIAC) 20 MG/ML IV SOLN
INTRAVENOUS | Status: DC | PRN
  Administered 2016-06-21: 80 mg via INTRAVENOUS

## 2016-06-21 MED ORDER — DIPHENHYDRAMINE HCL 50 MG/ML IJ SOLN
INTRAMUSCULAR | Status: AC
  Filled 2016-06-21: qty 1

## 2016-06-21 MED ORDER — BUPIVACAINE HCL (PF) 0.25 % IJ SOLN
INTRAMUSCULAR | Status: DC | PRN
  Administered 2016-06-21: 14 mL

## 2016-06-21 MED ORDER — ROCURONIUM BROMIDE 100 MG/10ML IV SOLN
INTRAVENOUS | Status: AC
  Filled 2016-06-21: qty 1

## 2016-06-21 MED ORDER — MIDAZOLAM HCL 2 MG/2ML IJ SOLN
INTRAMUSCULAR | Status: AC
  Filled 2016-06-21: qty 2

## 2016-06-21 MED ORDER — OXYCODONE-ACETAMINOPHEN 5-325 MG PO TABS
2.0000 | ORAL_TABLET | ORAL | Status: DC | PRN
  Administered 2016-06-21: 2 via ORAL
  Filled 2016-06-21: qty 2

## 2016-06-21 MED ORDER — FENTANYL CITRATE (PF) 100 MCG/2ML IJ SOLN
INTRAMUSCULAR | Status: DC | PRN
  Administered 2016-06-21 (×2): 50 ug via INTRAVENOUS
  Administered 2016-06-21: 100 ug via INTRAVENOUS
  Administered 2016-06-21: 50 ug via INTRAVENOUS
  Administered 2016-06-21: 100 ug via INTRAVENOUS
  Administered 2016-06-21: 50 ug via INTRAVENOUS

## 2016-06-21 MED ORDER — STERILE WATER FOR IRRIGATION IR SOLN
Status: DC | PRN
  Administered 2016-06-21: 1000 mL

## 2016-06-21 MED ORDER — SILVER NITRATE-POT NITRATE 75-25 % EX MISC
CUTANEOUS | Status: AC
  Filled 2016-06-21: qty 1

## 2016-06-21 MED ORDER — NALOXONE HCL 0.4 MG/ML IJ SOLN: 0.4000 mg | INTRAMUSCULAR | Status: DC | PRN

## 2016-06-21 MED ORDER — KETOROLAC TROMETHAMINE 30 MG/ML IJ SOLN: 30.0000 mg | Freq: Once | INTRAMUSCULAR | Status: DC

## 2016-06-21 MED ORDER — DIPHENHYDRAMINE HCL 50 MG/ML IJ SOLN
12.5000 mg | Freq: Four times a day (QID) | INTRAMUSCULAR | Status: DC | PRN
  Administered 2016-06-21 – 2016-06-22 (×2): 12.5 mg via INTRAVENOUS
  Filled 2016-06-21: qty 1

## 2016-06-21 MED ORDER — CEFAZOLIN SODIUM-DEXTROSE 2-4 GM/100ML-% IV SOLN
2.0000 g | Freq: Three times a day (TID) | INTRAVENOUS | Status: DC
  Administered 2016-06-21 (×2): 2 g via INTRAVENOUS
  Filled 2016-06-21 (×7): qty 100

## 2016-06-21 MED ORDER — GLYCOPYRROLATE 0.2 MG/ML IJ SOLN
INTRAMUSCULAR | Status: DC | PRN
  Administered 2016-06-21: 0.6 mg via INTRAVENOUS

## 2016-06-21 MED ORDER — KETOROLAC TROMETHAMINE 30 MG/ML IJ SOLN
30.0000 mg | Freq: Four times a day (QID) | INTRAMUSCULAR | Status: DC
  Filled 2016-06-21: qty 1

## 2016-06-21 MED ORDER — SILVER SULFADIAZINE 1 % EX CREA
TOPICAL_CREAM | CUTANEOUS | Status: AC
  Filled 2016-06-21: qty 85

## 2016-06-21 MED ORDER — KETOROLAC TROMETHAMINE 30 MG/ML IJ SOLN
INTRAMUSCULAR | Status: DC | PRN
  Administered 2016-06-21: 30 mg via INTRAVENOUS

## 2016-06-21 MED ORDER — LACTATED RINGERS IV SOLN
INTRAVENOUS | Status: DC
  Administered 2016-06-21 (×2): via INTRAVENOUS

## 2016-06-21 MED ORDER — FENTANYL 40 MCG/ML IV SOLN
INTRAVENOUS | Status: DC
  Administered 2016-06-21: 15:00:00 via INTRAVENOUS
  Administered 2016-06-21: 240 ug via INTRAVENOUS
  Administered 2016-06-21: 3.38 ug via INTRAVENOUS
  Administered 2016-06-22: 180 ug via INTRAVENOUS
  Administered 2016-06-22: 105 ug via INTRAVENOUS
  Filled 2016-06-21: qty 25

## 2016-06-21 MED ORDER — LACTATED RINGERS IV SOLN
INTRAVENOUS | Status: DC
  Administered 2016-06-21 – 2016-06-22 (×2): via INTRAVENOUS

## 2016-06-21 MED ORDER — TRAMADOL HCL 50 MG PO TABS: 50.0000 mg | ORAL_TABLET | Freq: Four times a day (QID) | ORAL | Status: DC | PRN

## 2016-06-21 MED ORDER — FENTANYL CITRATE (PF) 100 MCG/2ML IJ SOLN
INTRAMUSCULAR | Status: AC
  Filled 2016-06-21: qty 2

## 2016-06-21 MED ORDER — FENTANYL CITRATE (PF) 250 MCG/5ML IJ SOLN
INTRAMUSCULAR | Status: AC
  Filled 2016-06-21: qty 5

## 2016-06-21 MED ORDER — ONDANSETRON HCL 4 MG/2ML IJ SOLN
INTRAMUSCULAR | Status: AC
  Filled 2016-06-21: qty 2

## 2016-06-21 MED ORDER — SCOPOLAMINE 1 MG/3DAYS TD PT72
1.0000 | MEDICATED_PATCH | Freq: Once | TRANSDERMAL | Status: DC
  Administered 2016-06-21: 1.5 mg via TRANSDERMAL

## 2016-06-21 MED ORDER — ONDANSETRON HCL 4 MG/2ML IJ SOLN: 4.0000 mg | Freq: Four times a day (QID) | INTRAMUSCULAR | Status: DC | PRN

## 2016-06-21 MED ORDER — FENTANYL CITRATE (PF) 250 MCG/5ML IJ SOLN
INTRAMUSCULAR | Status: AC
Start: 2016-06-21 — End: 2016-06-21
  Filled 2016-06-21: qty 5

## 2016-06-21 MED ORDER — ONDANSETRON HCL 4 MG PO TABS: 4.0000 mg | ORAL_TABLET | Freq: Four times a day (QID) | ORAL | Status: DC | PRN

## 2016-06-21 MED ORDER — IBUPROFEN 800 MG PO TABS: 800.0000 mg | ORAL_TABLET | Freq: Three times a day (TID) | ORAL | Status: DC | PRN

## 2016-06-21 MED ORDER — ROCURONIUM BROMIDE 100 MG/10ML IV SOLN
INTRAVENOUS | Status: DC | PRN
  Administered 2016-06-21: 10 mg via INTRAVENOUS
  Administered 2016-06-21: 30 mg via INTRAVENOUS
  Administered 2016-06-21 (×2): 10 mg via INTRAVENOUS

## 2016-06-21 MED ORDER — KETOROLAC TROMETHAMINE 30 MG/ML IJ SOLN
INTRAMUSCULAR | Status: AC
  Filled 2016-06-21: qty 1

## 2016-06-21 MED ORDER — PROMETHAZINE HCL 25 MG/ML IJ SOLN
6.2500 mg | INTRAMUSCULAR | Status: DC | PRN
Start: 2016-06-21 — End: 2016-06-21

## 2016-06-21 MED ORDER — NEOSTIGMINE METHYLSULFATE 10 MG/10ML IV SOLN
INTRAVENOUS | Status: DC | PRN
  Administered 2016-06-21: 3 mg via INTRAVENOUS

## 2016-06-21 MED ORDER — SODIUM CHLORIDE 0.9 % IJ SOLN
INTRAMUSCULAR | Status: AC
  Filled 2016-06-21: qty 10

## 2016-06-21 MED ORDER — FENTANYL CITRATE (PF) 100 MCG/2ML IJ SOLN
25.0000 ug | INTRAMUSCULAR | Status: DC | PRN
  Administered 2016-06-21: 25 ug via INTRAVENOUS
  Administered 2016-06-21: 50 ug via INTRAVENOUS
  Administered 2016-06-21: 25 ug via INTRAVENOUS
  Administered 2016-06-21: 50 ug via INTRAVENOUS

## 2016-06-21 MED ORDER — BUPIVACAINE HCL (PF) 0.25 % IJ SOLN
INTRAMUSCULAR | Status: AC
  Filled 2016-06-21: qty 30

## 2016-06-21 SURGICAL SUPPLY — 52 items
APL SRG 38 LTWT LNG FL B (MISCELLANEOUS) ×2
APPLICATOR ARISTA FLEXITIP XL (MISCELLANEOUS) ×3 IMPLANT
BARRIER ADHS 3X4 INTERCEED (GAUZE/BANDAGES/DRESSINGS) IMPLANT
BRR ADH 4X3 ABS CNTRL BYND (GAUZE/BANDAGES/DRESSINGS)
CABLE HIGH FREQUENCY MONO STRZ (ELECTRODE) ×3 IMPLANT
CLOTH BEACON ORANGE TIMEOUT ST (SAFETY) ×3 IMPLANT
COVER LIGHT HANDLE  1/PK (MISCELLANEOUS) ×2
COVER LIGHT HANDLE 1/PK (MISCELLANEOUS) ×4 IMPLANT
COVER TABLE BACK 60X90 (DRAPES) ×3 IMPLANT
DISSECTOR BLUNT TIP ENDO 5MM (MISCELLANEOUS) ×3 IMPLANT
DRAPE SHEET LG 3/4 BI-LAMINATE (DRAPES) ×3 IMPLANT
DURAPREP 26ML APPLICATOR (WOUND CARE) ×3 IMPLANT
FILTER SMOKE EVAC LAPAROSHD (FILTER) ×3 IMPLANT
GAUZE SPONGE 4X4 16PLY XRAY LF (GAUZE/BANDAGES/DRESSINGS) ×3 IMPLANT
GLOVE BIO SURGEON STRL SZ 6.5 (GLOVE) ×3 IMPLANT
GLOVE BIO SURGEON STRL SZ7 (GLOVE) ×3 IMPLANT
GLOVE BIOGEL PI IND STRL 7.0 (GLOVE) ×10 IMPLANT
GLOVE BIOGEL PI INDICATOR 7.0 (GLOVE) ×5
HEMOSTAT ARISTA ABSORB 3G PWDR (MISCELLANEOUS) ×3 IMPLANT
LIGASURE BLUNT 5MM 37CM (INSTRUMENTS) ×3 IMPLANT
LIQUID BAND (GAUZE/BANDAGES/DRESSINGS) ×3 IMPLANT
MANIPULATOR VCARE LG CRV RETR (MISCELLANEOUS) IMPLANT
MANIPULATOR VCARE SML CRV RETR (MISCELLANEOUS) IMPLANT
MANIPULATOR VCARE STD CRV RETR (MISCELLANEOUS) ×3 IMPLANT
NEEDLE INSUFFLATION 120MM (ENDOMECHANICALS) ×3 IMPLANT
NS IRRIG 1000ML POUR BTL (IV SOLUTION) ×3 IMPLANT
PACK LAPAROSCOPY BASIN (CUSTOM PROCEDURE TRAY) ×3 IMPLANT
PACK ROBOTIC GOWN (GOWN DISPOSABLE) ×3 IMPLANT
PAD TRENDELENBURG POSITION (MISCELLANEOUS) ×3 IMPLANT
PENCIL BUTTON HOLSTER BLD 10FT (ELECTRODE) ×3 IMPLANT
POUCH LAPAROSCOPIC INSTRUMENT (MISCELLANEOUS) ×3 IMPLANT
SCISSORS LAP 5X35 DISP (ENDOMECHANICALS) IMPLANT
SET CYSTO W/LG BORE CLAMP LF (SET/KITS/TRAYS/PACK) ×3 IMPLANT
SET IRRIG TUBING LAPAROSCOPIC (IRRIGATION / IRRIGATOR) ×3 IMPLANT
SLEEVE XCEL OPT CAN 5 100 (ENDOMECHANICALS) ×3 IMPLANT
SUT VIC AB 0 CT1 18XCR BRD8 (SUTURE) ×2 IMPLANT
SUT VIC AB 0 CT1 36 (SUTURE) ×9 IMPLANT
SUT VIC AB 0 CT1 8-18 (SUTURE) ×3
SUT VIC AB 2-0 CT1 (SUTURE) ×6 IMPLANT
SUT VICRYL 0 UR6 27IN ABS (SUTURE) ×3 IMPLANT
SUT VICRYL 4-0 PS2 18IN ABS (SUTURE) ×3 IMPLANT
SYR 50ML LL SCALE MARK (SYRINGE) ×3 IMPLANT
SYR BULB IRRIGATION 50ML (SYRINGE) ×6 IMPLANT
SYRINGE 10CC LL (SYRINGE) ×3 IMPLANT
TOWEL OR 17X24 6PK STRL BLUE (TOWEL DISPOSABLE) ×9 IMPLANT
TRAY FOLEY CATH SILVER 14FR (SET/KITS/TRAYS/PACK) ×3 IMPLANT
TROCAR XCEL NON-BLD 11X100MML (ENDOMECHANICALS) ×6 IMPLANT
TROCAR XCEL NON-BLD 5MMX100MML (ENDOMECHANICALS) ×3 IMPLANT
TUBING CONNECTOR 18X5MM (MISCELLANEOUS) ×3 IMPLANT
WARMER LAPAROSCOPE (MISCELLANEOUS) ×3 IMPLANT
WATER STERILE IRR 1000ML POUR (IV SOLUTION) ×3 IMPLANT
YANKAUER SUCT BULB TIP NO VENT (SUCTIONS) ×3 IMPLANT

## 2016-06-21 NOTE — Addendum Note (Signed)
Addendum  created 06/21/16 1631 by Orlie Pollen, CRNA   Sign clinical note

## 2016-06-21 NOTE — Anesthesia Procedure Notes (Signed)
Procedure Name: Intubation Date/Time: 06/21/2016 9:47 AM Performed by: Cephus Shelling A Pre-anesthesia Checklist: Patient identified, Emergency Drugs available, Suction available and Patient being monitored Patient Re-evaluated:Patient Re-evaluated prior to inductionOxygen Delivery Method: Circle system utilized and Simple face mask Preoxygenation: Pre-oxygenation with 100% oxygen Intubation Type: IV induction Ventilation: Mask ventilation without difficulty Laryngoscope Size: Mac and 3 Grade View: Grade II Tube type: Oral Tube size: 7.0 mm Number of attempts: 1 Airway Equipment and Method: Stylet Placement Confirmation: ETT inserted through vocal cords under direct vision,  positive ETCO2 and breath sounds checked- equal and bilateral Secured at: 20 (right lip) cm Tube secured with: Tape Dental Injury: Teeth and Oropharynx as per pre-operative assessment

## 2016-06-21 NOTE — Transfer of Care (Signed)
Immediate Anesthesia Transfer of Care Note  Patient: Diamond Bennett  Procedure(s) Performed: Procedure(s) with comments: HYSTERECTOMY TOTAL LAPAROSCOPIC (N/A) - Bilateral salpingectomy  Thayer Ohm confirmed to come with [facility] 06/08/2016 by Geraldine Contras CYSTOSCOPY (N/A)  Patient Location: PACU  Anesthesia Type:General  Level of Consciousness: sedated  Airway & Oxygen Therapy: Patient Spontanous Breathing  Post-op Assessment: Report given to RN  Post vital signs: Reviewed and stable  Last Vitals:  Vitals:   06/21/16 0831  BP: 105/74  Pulse: 94  Resp: 18  Temp: 36.6 C    Last Pain:  Vitals:   06/21/16 0831  TempSrc: Oral  PainSc: 3          Complications: No apparent anesthesia complications

## 2016-06-21 NOTE — Anesthesia Preprocedure Evaluation (Signed)
Anesthesia Evaluation  Patient identified by MRN, date of birth, ID band Patient awake    Reviewed: Allergy & Precautions, NPO status , Patient's Chart, lab work & pertinent test results  History of Anesthesia Complications Negative for: history of anesthetic complications  Airway Mallampati: III   Neck ROM: Full    Dental  (+) Teeth Intact   Pulmonary asthma ,  Cough for the past 2 months with clear sputum. Lungs CTAB. Afebrile. Denies fever.   Pulmonary exam normal breath sounds clear to auscultation       Cardiovascular negative cardio ROS Normal cardiovascular exam     Neuro/Psych  Neuromuscular disease (fibromyalgia) negative psych ROS   GI/Hepatic Neg liver ROS, GERD  ,  Endo/Other  negative endocrine ROS  Renal/GU H/o kidney stones  negative genitourinary   Musculoskeletal  (+) Fibromyalgia -  Abdominal   Peds negative pediatric ROS (+)  Hematology negative hematology ROS (+)   Anesthesia Other Findings   Reproductive/Obstetrics negative OB ROS                             Anesthesia Physical Anesthesia Plan  ASA: II  Anesthesia Plan: General   Post-op Pain Management:    Induction: Intravenous  Airway Management Planned: Oral ETT  Additional Equipment:   Intra-op Plan:   Post-operative Plan: Extubation in OR  Informed Consent: I have reviewed the patients History and Physical, chart, labs and discussed the procedure including the risks, benefits and alternatives for the proposed anesthesia with the patient or authorized representative who has indicated his/her understanding and acceptance.   Dental advisory given  Plan Discussed with:   Anesthesia Plan Comments:         Anesthesia Quick Evaluation

## 2016-06-21 NOTE — Anesthesia Postprocedure Evaluation (Signed)
Anesthesia Post Note  Patient: Diamond Bennett  Procedure(s) Performed: Procedure(s) (LRB): HYSTERECTOMY TOTAL LAPAROSCOPIC (N/A) CYSTOSCOPY (N/A)  Patient location during evaluation: PACU Anesthesia Type: General Level of consciousness: awake and alert Pain management: pain level controlled Vital Signs Assessment: post-procedure vital signs reviewed and stable Cardiovascular status: stable Postop Assessment: no signs of nausea or vomiting Anesthetic complications: no     Last Vitals:  Vitals:   06/21/16 1300 06/21/16 1330  BP: 115/68 119/75  Pulse: 87 88  Resp: 18 (!) 22  Temp:  37 C    Last Pain:  Vitals:   06/21/16 1330  TempSrc:   PainSc: 9    Pain Goal: Patients Stated Pain Goal: 5 (06/21/16 1230)               Maicol Bowland JR,JOHN Japheth Diekman

## 2016-06-21 NOTE — Op Note (Signed)
OPERATIVE NOTE  Diamond Bennett  DOB:    1970/11/25  MRN:    [id number]  CSN:    [id number]  Date of Surgery:  06/21/2016  Preoperative Diagnosis: 1. Abnormal uterine bleeding 2. Dysmenorrhea  Postoperative Diagnosis: Same as above plus endometriosis  Procedure: Total Laparoscopic hysterectomy Bilateral salpingectomy Cystoscopy  Surgeon: Delila Spence. Mora Appl, M.D.  Assistant: Carrington Clamp, MD  Anesthetic: General  Disposition: The patient presents with the above-mentioned diagnosis. She understands the indications for surgical procedure.  She also understands the alternative treatment options. She accepts the risk of, but not limited to, anesthetic complications, bleeding, infections, and possible damage to the surrounding organs.  Indication: 45 yo with heavy menstrual bleeding, with recurrent symptomatic anemia.  Patient also with severe dysmenorrhea  Findings: EUA: External vulva: normal vaginal vault: normal cervix: normal uterus: enlarged, mobile Adnexa: no palpable masses.   Laparoscopy: enlarged, globular appearing uterus, normal appearing fallopian tubes and ovaries; normal appendix, omental adhesions to anterior abdominal wall.  Endometriotic implants anterior and  posterior cul de sac and left pelvic side wall.   Procedure: The patient was brought into the operating room with an intravenous line in placed, and anesthetic was administered. She was placed in a low anterior lithotomy position using Allen stirrups. The vaginal portion of the procedure included placement of a Vesicare medium uterine manipulator with a colpotomy ring and a vaginal occluder balloon.  The laparoscopic port sites were anesthetized with intradermal injection of 0.25% Marcaine. There were 4 ports placed, a 10-mm umbilical port,and 5-mm right and left lower quadrant ports and a  78mm port @2cm  left of the umbilicus.   A 1cm subumbilical incision was made with a 15 blade scalpel and the 78mm 0  degree laparoscope attached to the Excel visiport was used to enter the abdomen directly under direct video visualization. The abdominal entry was confirmed with the laparoscope and pneumoperitoneum was insufflated without difficulty. The patient was placed in steep Trendelenburg.  The 5-mm accessory ports was then placed in the left and right lower quadrants under direct laparoscopic guidance . There was no noted injury with placement of any of the trocars.   The ureter was visualized coursing well under the operative site. The left mesosalpinx was divided using 76mm Ligasure. The uteroovarian ligament was cauterized with the Ligasure and transected.  The right round ligament was then cauterized and transected via the Ligasure. The anterior broad ligament was then isolated and the the mesovarium was skeletonized. A bladder flap was mobilized by the endoshears attached to monopolar cautery and the peritoneum on the vesicouterine fold was incised to mobilize the bladder. The above was repeated for the contralateral side. There was hemostasis noted at the uteroovarian and round ligament pedicles. Once the Vesicare colpotomy ring was skeletonized and in position, the uterine arteries were sealed and transected using the Ligasure at the level of the colpotomy ring. The vagina was transected using the monopolar endoshears resulting in separation of the uterus and attached tubes and ovaries. The uterus, tubes, and ovaries were then delivered through the vagina. The vaginal cuff was closed vaginally with interrupted sutures of 0-vicryl.  A modified McCalls colposuspension was performed and the vagina was closed with interrupted suture of 0-vicryl.    A cystoscopy was then performed using a 70 degree cystoscopy and a complete bladder survey was performed. There was no noted injury to the bladder urothelium and there were brisk jets from both ureteral orifices.   Surgeons gowns and gloves were changed  and attention was  returned to the abdomen.  Pneumoperitoneum was obtained again and the pelvis was examined and excellent hemostasis noted. The abdomen and pelvis was irrigated thoroughly.  The insufflation pressure was reduced, and no evidence of bleeding was seen. The accessory ports were removed under direct laparoscopic guidance and  the pneumoperitoneum was released. The umbilical port was removed using laparoscopic guidance. The umbilical fascia was closed with an interrupted figure-of-eight stitch using 2-0 Vicryl. The skin was closed with subcuticular stitches using 4-0 Monocryl suture. Dermabond was used to cover each incision site and op sites placed.  The final sponge, needle, and instrument counts were correct at the completion of the procedure. The patient tolerated the procedure well and was awakened and taken to the post anesthesia care unit in stable condition.   Diamond Bennett Diamond Bennett

## 2016-06-21 NOTE — Anesthesia Postprocedure Evaluation (Signed)
Anesthesia Post Note  Patient: Diamond Bennett  Procedure(s) Performed: Procedure(s) (LRB): HYSTERECTOMY TOTAL LAPAROSCOPIC (N/A) CYSTOSCOPY (N/A)  Patient location during evaluation: [facility] Anesthesia Type: General Level of consciousness: awake and alert Pain management: pain level controlled Vital Signs Assessment: post-procedure vital signs reviewed and stable Respiratory status: spontaneous breathing, nonlabored ventilation, respiratory function stable and patient connected to nasal cannula oxygen Cardiovascular status: blood pressure returned to baseline and stable Postop Assessment: no signs of nausea or vomiting Anesthetic complications: no     Last Vitals:  Vitals:   06/21/16 1430 06/21/16 1529  BP: 133/70 123/72  Pulse: 90 78  Resp: 18 17  Temp: 36.4 C 36.6 C    Last Pain:  Vitals:   06/21/16 1530  TempSrc:   PainSc: 5    Pain Goal: Patients Stated Pain Goal: 5 (06/21/16 1530)               Marrion Coy

## 2016-06-21 NOTE — H&P (Signed)
Diamond Bennett is an 46 y.o. female P3 with long history of abnormal uterine bleeding from symptomatic fibroid uterus.   Pertinent Gynecological History: Menses: flow is excessive with use of 5 pads or tampons on heaviest days Bleeding: dysfunctional uterine bleeding Contraception: OCP (estrogen/progesterone) DES exposure: denies Blood transfusions: none Sexually transmitted diseases: no past history Previous GYN Procedures: c-sections  Last mammogram: normal Date: 02/2016 Last pap: normal Date: 12/2014 OB History: G3, P3   Menstrual History: Menarche age: 68 Patient's last menstrual period was 05/23/2016 (approximate).    Past Medical History:  Diagnosis Date  . Anemia   . Asthma    allergy induced asthma questionable  . Fibromyalgia   . GERD (gastroesophageal reflux disease)   . History of umbilical hernia repair   . Kidney stone   . Persistent dry cough     Past Surgical History:  Procedure Laterality Date  . CESAREAN SECTION     3 times  . HERNIA REPAIR      History reviewed. No pertinent family history.  Social History:  reports that she has never smoked. She has never used smokeless tobacco. She reports that she does not drink alcohol or use drugs.  Allergies:  Allergies  Allergen Reactions  . Morphine And Related Itching    Prescriptions Prior to Admission  Medication Sig Dispense Refill Last Dose  . ALAYCEN 1/35 tablet Take 1 tablet by mouth daily.  4 Past Week at Unknown time  . ALPRAZolam (XANAX) 0.5 MG tablet Take 0.5 mg by mouth at bedtime as needed for anxiety or sleep.    Past Week at Unknown time  . folic acid (FOLVITE) 1 MG tablet Take 1 tablet (1 mg total) by mouth daily. 30 tablet 6 Past Week at Unknown time  . mometasone-formoterol (DULERA) 100-5 MCG/ACT AERO Inhale 2 puffs into the lungs 2 (two) times daily.   06/21/2016 at 0630  . montelukast (SINGULAIR) 10 MG tablet Take 10 mg by mouth at bedtime.    06/20/2016 at Unknown time  . omeprazole  (PRILOSEC) 20 MG capsule Take 20 mg by mouth 2 (two) times daily before a meal.    06/21/2016 at 0630  . predniSONE (DELTASONE) 10 MG tablet Take 10-14 mg by mouth See admin instructions. Tapered dose   Past Month at Unknown time    Review of Systems  Constitutional: Negative.  Negative for chills and fever.  HENT: Negative.  Negative for hearing loss and tinnitus.   Eyes: Negative.  Negative for blurred vision, double vision and photophobia.  Cardiovascular: Negative.  Negative for chest pain, palpitations and orthopnea.  Gastrointestinal: Negative.  Negative for heartburn, nausea and vomiting.  Genitourinary: Negative.  Negative for dysuria.  Musculoskeletal: Negative.  Negative for back pain, myalgias and neck pain.  Skin: Negative.  Negative for itching and rash.  Neurological: Negative.  Negative for dizziness, tingling, tremors and headaches.  Endo/Heme/Allergies: Negative.  Negative for environmental allergies. Does not bruise/bleed easily.  Psychiatric/Behavioral: Negative.   All other systems reviewed and are negative.   Blood pressure 105/74, pulse 94, temperature 97.9 F (36.6 C), temperature source Oral, resp. rate 18, last menstrual period 05/23/2016, SpO2 98 %. Physical Exam  Nursing note and vitals reviewed. Constitutional: She is oriented to person, place, and time. She appears well-developed and well-nourished.  HENT:  Head: Normocephalic and atraumatic.  Eyes: Conjunctivae are normal. Pupils are equal, round, and reactive to light.  Neck: Normal range of motion.  Cardiovascular: Normal rate, regular rhythm and normal  heart sounds.   Respiratory: Effort normal and breath sounds normal.  GI: Soft. Bowel sounds are normal.  Genitourinary: Vagina normal and uterus normal. No vaginal discharge found.  Neurological: She is alert and oriented to person, place, and time.  Skin: Skin is warm.    Results for orders placed or performed during the hospital encounter of  06/21/16 (from the past 24 hour(s))  CBC     Status: Abnormal   Collection Time: 06/21/16  8:23 AM  Result Value Ref Range   WBC 5.6 4.0 - 10.5 K/uL   RBC 4.00 3.87 - 5.11 MIL/uL   Hemoglobin 10.7 (L) 12.0 - 15.0 g/dL   HCT 16.1 (L) 09.6 - 04.5 %   MCV 83.3 78.0 - 100.0 fL   MCH 26.8 26.0 - 34.0 pg   MCHC 32.1 30.0 - 36.0 g/dL   RDW 40.9 (H) 81.1 - 91.4 %   Platelets 203 150 - 400 K/uL    No results found.  Assessment/Plan: 45 yo with abnormal uterine bleeding, fibroid uterus To OR for TLH possible TAH bilateral salpingectomies, possible cystoscopy Patient counseled already about risks of surgery to include bleeding, infection, injury to other  Organs (ie bladder, bowel, ureters) and need for ex lap to repair.  Patient aware the risk of diagnosis Of malignancy once pathology returns and need for further surgery.   Ancef on call to OR for surgical infection prophylaxis.     Diamond Bennett 06/21/2016, 8:53 AM

## 2016-06-22 DIAGNOSIS — N939 Abnormal uterine and vaginal bleeding, unspecified: Secondary | ICD-10-CM | POA: Diagnosis not present

## 2016-06-22 LAB — CBC
HEMATOCRIT: 25.7 % — AB (ref 36.0–46.0)
HEMOGLOBIN: 8.2 g/dL — AB (ref 12.0–15.0)
MCH: 27.2 pg (ref 26.0–34.0)
MCHC: 31.9 g/dL (ref 30.0–36.0)
MCV: 85.1 fL (ref 78.0–100.0)
Platelets: 176 10*3/uL (ref 150–400)
RBC: 3.02 MIL/uL — AB (ref 3.87–5.11)
RDW: 17.8 % — ABNORMAL HIGH (ref 11.5–15.5)
WBC: 7.8 10*3/uL (ref 4.0–10.5)

## 2016-06-22 MED ORDER — OXYCODONE-ACETAMINOPHEN 5-325 MG PO TABS
2.0000 | ORAL_TABLET | ORAL | Status: DC
  Administered 2016-06-22 (×3): 2 via ORAL
  Filled 2016-06-22 (×3): qty 2

## 2016-06-22 MED ORDER — DOCUSATE SODIUM 100 MG PO CAPS: 100.0000 mg | ORAL_CAPSULE | Freq: Two times a day (BID) | ORAL | 3 refills | Status: AC | PRN

## 2016-06-22 MED ORDER — IBUPROFEN 800 MG PO TABS
800.0000 mg | ORAL_TABLET | Freq: Three times a day (TID) | ORAL | Status: DC
  Administered 2016-06-22: 800 mg via ORAL
  Filled 2016-06-22: qty 1

## 2016-06-22 MED ORDER — FERROUS SULFATE 300 (60 FE) MG/5ML PO SYRP: 300.0000 mg | ORAL_SOLUTION | Freq: Two times a day (BID) | ORAL | 3 refills | Status: AC

## 2016-06-22 MED ORDER — IBUPROFEN 800 MG PO TABS
800.0000 mg | ORAL_TABLET | Freq: Three times a day (TID) | ORAL | 3 refills | Status: AC | PRN
Start: 2016-06-22 — End: ?

## 2016-06-22 MED ORDER — SIMETHICONE 80 MG PO CHEW
80.0000 mg | CHEWABLE_TABLET | Freq: Four times a day (QID) | ORAL | Status: DC
  Administered 2016-06-22 (×2): 80 mg via ORAL
  Filled 2016-06-22 (×2): qty 1

## 2016-06-22 MED ORDER — OXYCODONE-ACETAMINOPHEN 5-325 MG PO TABS: 1.0000 | ORAL_TABLET | ORAL | 0 refills | Status: AC | PRN

## 2016-06-22 NOTE — Progress Notes (Signed)
1 Day Post-Op Procedure(s) (LRB): HYSTERECTOMY TOTAL LAPAROSCOPIC (N/A) CYSTOSCOPY (N/A)  Subjective: Patient reports incisional pain, tolerating PO and no problems voiding.   She is not passing flatus.  Overnight her pain was not well controlled with  PCA, so percocet was added. She notes some itching with percocet no rash or other sx.  She is ambulating w/o difficulty. No chest pain, no shortness of breath. No Nausea or vomiting.   Objective: I have reviewed patient's vital signs, intake and output, medications and labs.  General: alert, cooperative and no distress GI: soft, appropriately tender, no rebound or guarding,  no masses,  no organomegaly,abnormal findings: abdomen distended and incision: clean, dry and intact Extremities: extremities normal, atraumatic, no cyanosis or edema, Homans sign is negative, no sign of DVT and SCDs on and running Vaginal Bleeding: none  Assessment: s/p Procedure(s) with comments: HYSTERECTOMY TOTAL LAPAROSCOPIC (N/A) - Bilateral salpingectomy  Thayer Ohm confirmed to come with [facility] 06/08/2016 by Geraldine Contras CYSTOSCOPY (N/A): stable, progressing well, tolerating diet and some abdominal distention, no flatus  Plan: Encourage ambulation Advance to PO medication, will D/C PCA and all IV meds Discontinue IV fluids  Will probably discharge patient this afternoon.    LOS: 0 days    Natividad Schlosser [name] 06/22/2016, 7:21 AM

## 2016-06-22 NOTE — Progress Notes (Signed)
Fentanyl PCA was wasted in sin k .==

## 2016-06-22 NOTE — Progress Notes (Signed)
Patient feeling much better this afternoon, pain is well controlled with po pain meds She is tolerating po and ambulating well  Vital signs stable  Exam: Benign abdomen, soft, non tender non distended, incisions c/d/i  A/P: Will discharge home now  [facility], Stein Windhorst [name]

## 2016-06-22 NOTE — Discharge Summary (Signed)
Physician Discharge Summary  Patient ID: Diamond Bennett MRN: [id number] DOB/AGE: [birth year] 45 y.o.  Admit date: 06/21/2016 Discharge date: 06/22/2016  Admission Diagnoses: Abnormal uterine bleeding  Discharge Diagnoses:  Active Problems:   S/P laparoscopic hysterectomy   Discharged Condition: good  Hospital Course: Patient taken to OR for TLH bilateral salpingectomy cystoscopy for abnormal uterine bleeding.  There were no intraoperative or post operative complications. On POD #1 patient was progressing well, and  Meeting all discharge criteria, therefore was discharged home.   Consults: None  Significant Diagnostic Studies: labs: post op hb: 8.2  Treatments: IV hydration and surgery: TLH, bilateral salpingectomy  Discharge Exam: Blood pressure 116/90, pulse 67, temperature 98.4 F (36.9 C), temperature source Oral, resp. rate 16, height 5\' 4"  (1.626 m), weight 74.8 kg (165 lb), last menstrual period 05/23/2016, SpO2 97 %. General appearance: alert, cooperative and no distress GI: soft, non-tender; bowel sounds normal; no masses,  no organomegaly  Extremities: extremities normal, atraumatic, no cyanosis or edema and Homans sign is negative, no sign of DVT Incision/Wound: clean dry intact  Disposition:   Discharge Instructions     Remove dressing in 72 hours    Complete by:  As directed   Call MD for:    Complete by:  As directed   Abnormal foul smelling vaginal discharge or heavy vaginal bleeding   Call MD for:  difficulty breathing, headache or visual disturbances    Complete by:  As directed   Call MD for:  hives    Complete by:  As directed   Call MD for:  persistant dizziness or light-headedness    Complete by:  As directed   Call MD for:  persistant nausea and vomiting    Complete by:  As directed   Call MD for:  redness, tenderness, or signs of infection (pain, swelling, redness, odor or green/yellow discharge around incision site)    Complete by:  As directed   Call  MD for:  severe uncontrolled pain    Complete by:  As directed   Call MD for:  temperature >100.4    Complete by:  As directed   Diet - low sodium heart healthy    Complete by:  As directed   Discharge instructions    Complete by:  As directed   Call for your post op visit, and if you have any questions or concerns.   Driving Restrictions    Complete by:  As directed   No driving for 64-40 days or while taking percocet   Increase activity slowly    Complete by:  As directed   Lifting restrictions    Complete by:  As directed   Don't lift anything heavier than 20 pounds   Sexual Activity Restrictions    Complete by:  As directed   No intercourse or anything in vagina for 4-6 weeks or until seen at post op visit.       Medication List    TAKE these medications   ALAYCEN 1/35 tablet Generic drug:  norethindrone-ethinyl estradiol 1/35 Take 1 tablet by mouth daily.   ALPRAZolam 0.5 MG tablet Commonly known as:  XANAX Take 0.5 mg by mouth at bedtime as needed for anxiety or sleep.   docusate sodium 100 MG capsule Commonly known as:  COLACE Take 1 capsule (100 mg total) by mouth 2 (two) times daily as needed for mild constipation.   DULERA 100-5 MCG/ACT Aero Generic drug:  mometasone-formoterol Inhale 2 puffs into the lungs 2 (two)  times daily.   ferrous sulfate 300 (60 Fe) MG/5ML syrup Take 5 mLs (300 mg total) by mouth 2 (two) times daily with a meal.   folic acid 1 MG tablet Commonly known as:  FOLVITE Take 1 tablet (1 mg total) by mouth daily.   ibuprofen 800 MG tablet Commonly known as:  ADVIL,MOTRIN Take 1 tablet (800 mg total) by mouth every 8 (eight) hours as needed.   montelukast 10 MG tablet Commonly known as:  SINGULAIR Take 10 mg by mouth at bedtime.   omeprazole 20 MG capsule Commonly known as:  PRILOSEC Take 20 mg by mouth 2 (two) times daily before a meal.   oxyCODONE-acetaminophen 5-325 MG tablet Commonly known as:  PERCOCET/ROXICET Take 1-2 tablets by  mouth every 4 (four) hours as needed for severe pain.   predniSONE 10 MG tablet Commonly known as:  DELTASONE Take 10-14 mg by mouth See admin instructions. Tapered dose        Signed: Essie Hart [name] 06/22/2016, 5:24 PM

## 2016-06-22 NOTE — Progress Notes (Addendum)
Witnessed waste of fentanyl PCA into sink with Clinical cytogeneticist.

## 2016-06-23 ENCOUNTER — Encounter (HOSPITAL_COMMUNITY): Payer: Self-pay | Admitting: Obstetrics & Gynecology

## 2016-07-27 ENCOUNTER — Other Ambulatory Visit: Payer: Self-pay | Admitting: Obstetrics & Gynecology

## 2016-08-26 ENCOUNTER — Other Ambulatory Visit: Payer: BLUE CROSS/BLUE SHIELD

## 2016-08-26 ENCOUNTER — Ambulatory Visit: Payer: BLUE CROSS/BLUE SHIELD | Admitting: Family

## 2018-11-07 ENCOUNTER — Other Ambulatory Visit: Payer: Self-pay | Admitting: Obstetrics & Gynecology

## 2018-11-07 DIAGNOSIS — R928 Other abnormal and inconclusive findings on diagnostic imaging of breast: Secondary | ICD-10-CM

## 2018-11-08 ENCOUNTER — Ambulatory Visit: Payer: BLUE CROSS/BLUE SHIELD

## 2018-11-08 ENCOUNTER — Ambulatory Visit
Admission: RE | Admit: 2018-11-08 | Discharge: 2018-11-08 | Disposition: A | Discharge status: Home / Self Care | Payer: BLUE CROSS/BLUE SHIELD | Source: Ambulatory Visit | Attending: Obstetrics & Gynecology | Admitting: Obstetrics & Gynecology

## 2018-11-08 DIAGNOSIS — R928 Other abnormal and inconclusive findings on diagnostic imaging of breast: Secondary | ICD-10-CM

## 2019-06-14 ENCOUNTER — Other Ambulatory Visit: Payer: Self-pay | Admitting: Obstetrics & Gynecology

## 2019-06-14 DIAGNOSIS — N644 Mastodynia: Secondary | ICD-10-CM

## 2019-06-21 ENCOUNTER — Other Ambulatory Visit: Payer: Self-pay

## 2019-06-21 ENCOUNTER — Ambulatory Visit: Payer: BLUE CROSS/BLUE SHIELD

## 2019-06-21 ENCOUNTER — Ambulatory Visit
Admission: RE | Admit: 2019-06-21 | Discharge: 2019-06-21 | Disposition: A | Discharge status: Home / Self Care | Payer: BC Managed Care – PPO | Source: Ambulatory Visit | Attending: Obstetrics & Gynecology | Admitting: Obstetrics & Gynecology

## 2019-06-21 DIAGNOSIS — N644 Mastodynia: Secondary | ICD-10-CM

## 2020-07-28 ENCOUNTER — Ambulatory Visit (INDEPENDENT_AMBULATORY_CARE_PROVIDER_SITE_OTHER): Payer: No Typology Code available for payment source | Admitting: Orthopedic Surgery

## 2020-07-28 ENCOUNTER — Ambulatory Visit (INDEPENDENT_AMBULATORY_CARE_PROVIDER_SITE_OTHER): Payer: No Typology Code available for payment source

## 2020-07-28 ENCOUNTER — Other Ambulatory Visit: Payer: Self-pay

## 2020-07-28 DIAGNOSIS — M79645 Pain in left finger(s): Secondary | ICD-10-CM

## 2020-07-29 ENCOUNTER — Encounter: Payer: Self-pay | Admitting: Orthopedic Surgery

## 2020-07-29 NOTE — Progress Notes (Signed)
Office Visit Note   Patient: Diamond Bennett           Date of Birth: 1971-07-26           MRN: [id number] Visit Date: 07/28/2020              Requested by: No referring provider defined for this encounter. PCP: Patient, No Pcp Per  Chief Complaint  Patient presents with   Finger Injury    continued finger pain s/p injury last year      HPI: Patient is a 49 year old woman who initially states she jammed her left long finger about a year ago.  She was seen at [facility] orthopedics last year she states she had a splint for several months however the deformity has reoccurred after removing the splint.  Patient has no history of diabetes hypertension is not a smoker.   Assessment & Plan: Visit Diagnoses:  1. Pain in left finger(s)     Plan: I have recommended consult with hand surgery.  Discussed that she may need collateral ligament reconstruction or a different type of brace.  Follow-Up Instructions: Return if symptoms worsen or fail to improve.   Ortho Exam  Patient is alert, oriented, no adenopathy, well-dressed, normal affect, normal respiratory effort. Examination patient has a swan-neck deformity to the left long finger with hyperextension of the DIP joint and flexion of the PIP joint.  The joints to passively correct.  With correction of the flexion deformity of the PIP joint the hyperextension of the DIP joint resolves.  Patient clinically has disrupted the collateral ligaments at the PIP joint.  Imaging: XR Finger Middle Left  Result Date: 07/29/2020 2 view radiographs of the left hand shows congruent joint space there is extension of the long finger DIP joint with flexion of the PIP joint.  No images are attached to the encounter.  Labs: No results found for: HGBA1C, ESRSEDRATE, CRP, LABURIC, REPTSTATUS, GRAMSTAIN, CULT, LABORGA   Lab Results  Component Value Date   ALBUMIN 3.7 04/12/2016    No results found for: MG No results found for: VD25OH  No  results found for: PREALBUMIN CBC EXTENDED Latest Ref Rng & Units 06/22/2016 06/21/2016 06/09/2016  WBC 4.0 - 10.5 K/uL 7.8 5.6 10.8(H)  RBC 3.87 - 5.11 MIL/uL 3.02(L) 4.00 4.09  HGB 12.0 - 15.0 g/dL 8.2(L) 10.7(L) 10.7(L)  HCT 36 - 46 % 25.7(L) 33.3(L) 33.8(L)  PLT 150 - 400 K/uL 176 203 251  NEUTROABS 1.5 - 6.5 10e3/uL - - -  LYMPHSABS 0.9 - 3.3 10e3/uL - - -     There is no height or weight on file to calculate BMI.  Orders:  Orders Placed This Encounter  Procedures   XR Finger Middle Left   No orders of the defined types were placed in this encounter.    Procedures: No procedures performed  Clinical Data: No additional findings.  ROS:  All other systems negative, except as noted in the HPI. Review of Systems  Objective: Vital Signs: LMP 05/23/2016 (Approximate)   Specialty Comments:  No specialty comments available.  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  Past Surgical History:  Procedure Laterality Date   CESAREAN SECTION     3 times   CYSTOSCOPY N/A 06/21/2016   Procedure: CYSTOSCOPY;  Surgeon: Essie Hart, MD;  Location: [facility];  Service: Gynecology;  Laterality: N/A;   HERNIA REPAIR     LAPAROSCOPIC HYSTERECTOMY N/A 06/21/2016   Procedure:  HYSTERECTOMY TOTAL LAPAROSCOPIC;  Surgeon: Essie Hart, MD;  Location: [facility];  Service: Gynecology;  Laterality: N/A;  Bilateral salpingectomy  Thayer Ohm confirmed to come with [facility] 06/08/2016 by Geraldine Contras   Social History   Occupational History   Not on file  Tobacco Use   Smoking status: Never Smoker   Smokeless tobacco: Never Used  Substance and Sexual Activity   Alcohol use: No    Alcohol/week: 0.0 standard drinks   Drug use: No   Sexual activity: Yes    Birth control/protection: Pill

## 2020-08-28 ENCOUNTER — Telehealth: Payer: Self-pay | Admitting: Orthopedic Surgery

## 2020-08-28 NOTE — Telephone Encounter (Signed)
Received vm from patient wanting records sent to another doctors office. IC,lmvm advised need to sign release before we can send out records.

## 2020-09-15 ENCOUNTER — Telehealth: Payer: Self-pay | Admitting: Orthopedic Surgery

## 2020-09-15 NOTE — Telephone Encounter (Signed)
Please copy xray to CD. Patient needs to take with her to [facility]. She has signed release. [phone]. Thanks!

## 2020-09-16 NOTE — Telephone Encounter (Signed)
Called and advised patient CD is ready for pick up

## 2021-11-03 ENCOUNTER — Other Ambulatory Visit: Payer: Self-pay | Admitting: Obstetrics & Gynecology

## 2021-11-03 DIAGNOSIS — R928 Other abnormal and inconclusive findings on diagnostic imaging of breast: Secondary | ICD-10-CM

## 2021-12-14 ENCOUNTER — Ambulatory Visit
Admission: RE | Admit: 2021-12-14 | Discharge: 2021-12-14 | Disposition: A | Discharge status: Home / Self Care | Payer: No Typology Code available for payment source | Source: Ambulatory Visit | Attending: Obstetrics & Gynecology | Admitting: Obstetrics & Gynecology

## 2021-12-14 ENCOUNTER — Encounter: Payer: Self-pay | Admitting: Family

## 2021-12-14 ENCOUNTER — Ambulatory Visit: Payer: No Typology Code available for payment source

## 2021-12-14 DIAGNOSIS — R928 Other abnormal and inconclusive findings on diagnostic imaging of breast: Secondary | ICD-10-CM

## 2023-03-12 IMAGING — MG MM DIGITAL DIAGNOSTIC UNILAT*R* W/ TOMO W/ CAD
6 series · 6 of 18 positions shown · non-contrast
Comparison: Previous exam(s).

CLINICAL DATA: Patient recalled from screening for possible right
breast distortion.

EXAM:
DIGITAL DIAGNOSTIC UNILATERAL RIGHT MAMMOGRAM WITH TOMOSYNTHESIS AND
CAD
TECHNIQUE: Right digital diagnostic mammography and breast tomosynthesis was
performed. The images were evaluated with computer-aided detection.

[R ML synth-2D]
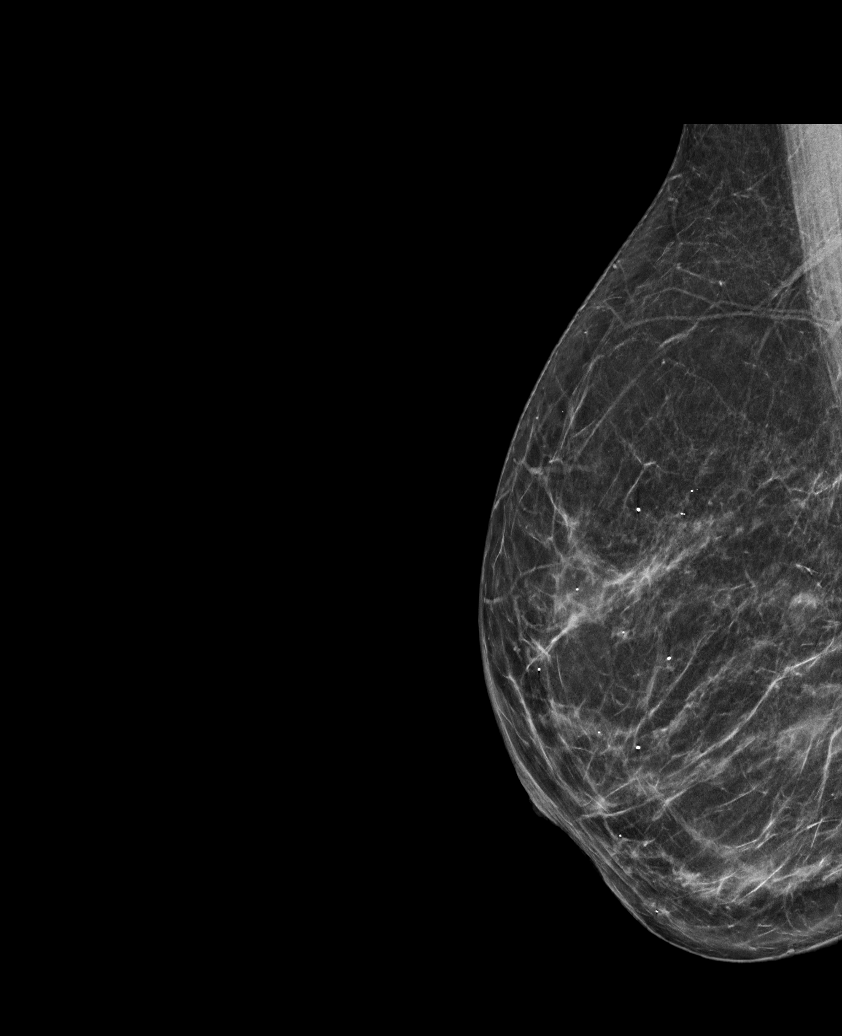

[R MLO synth-2D]
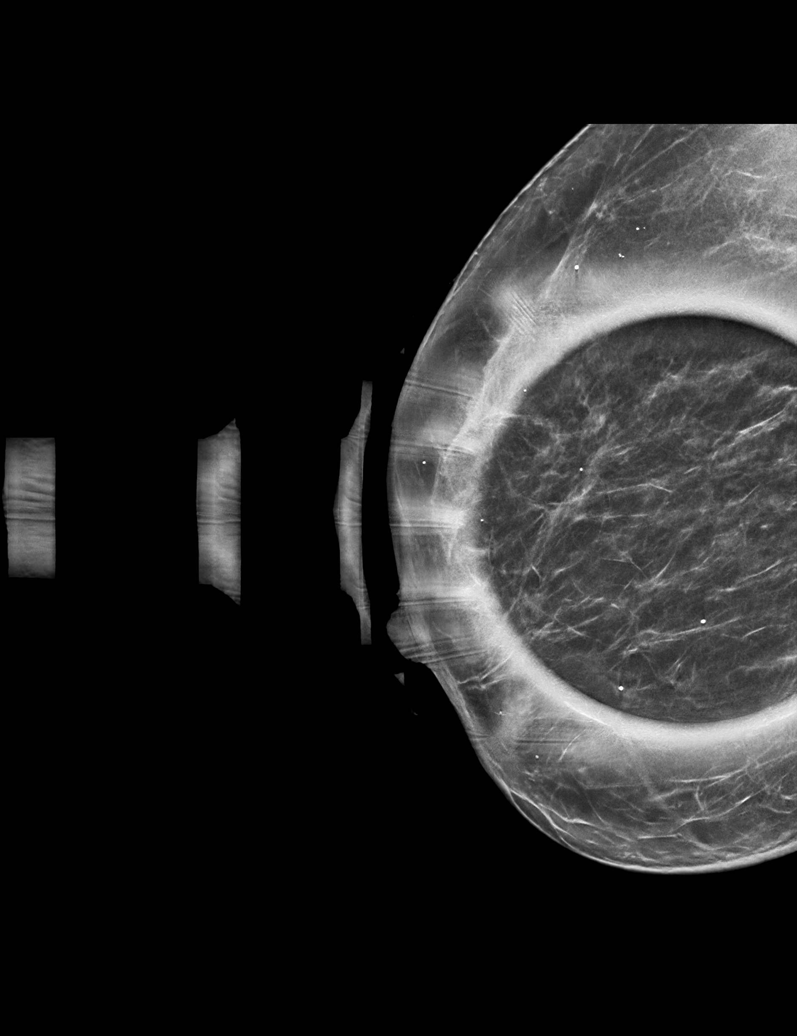

[R CC synth-2D]
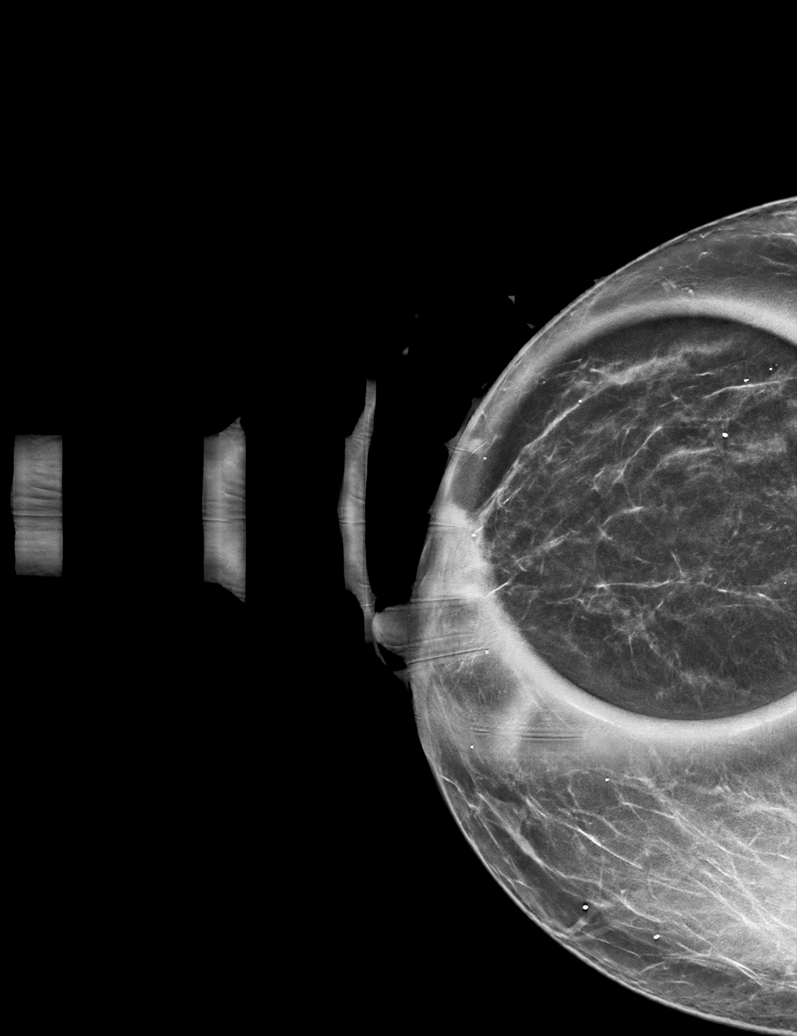

[R CC tomo · tomo slice 29/57.0]
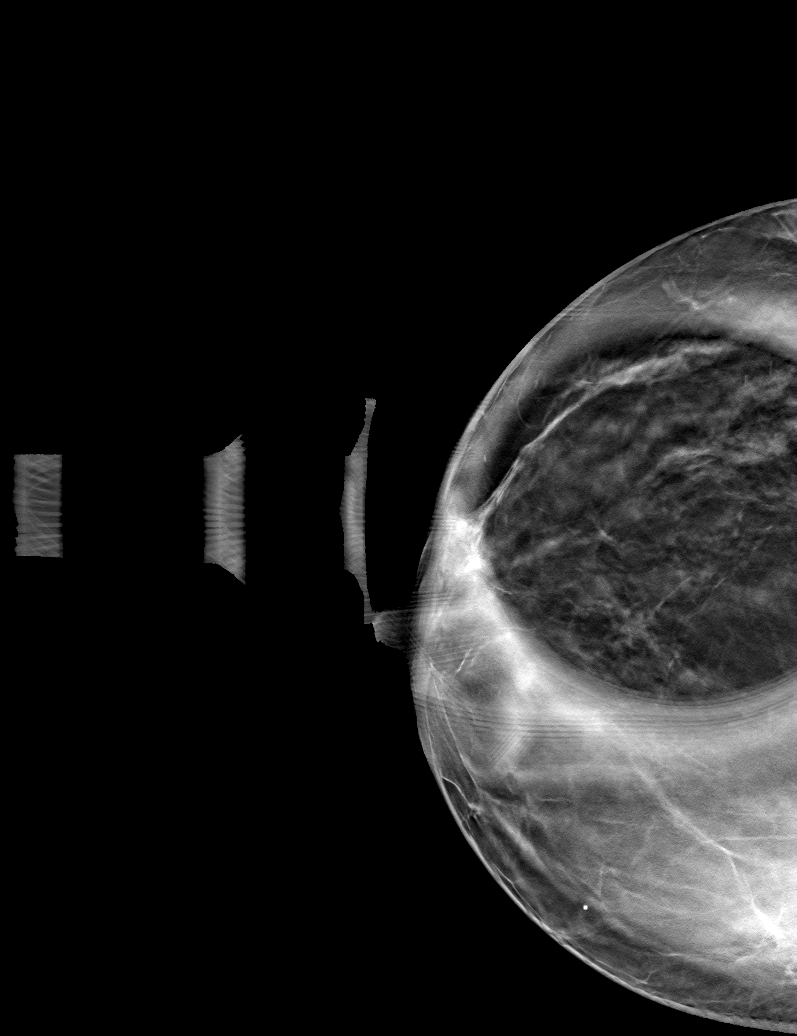

[R ML tomo · tomo slice 33/66.0]
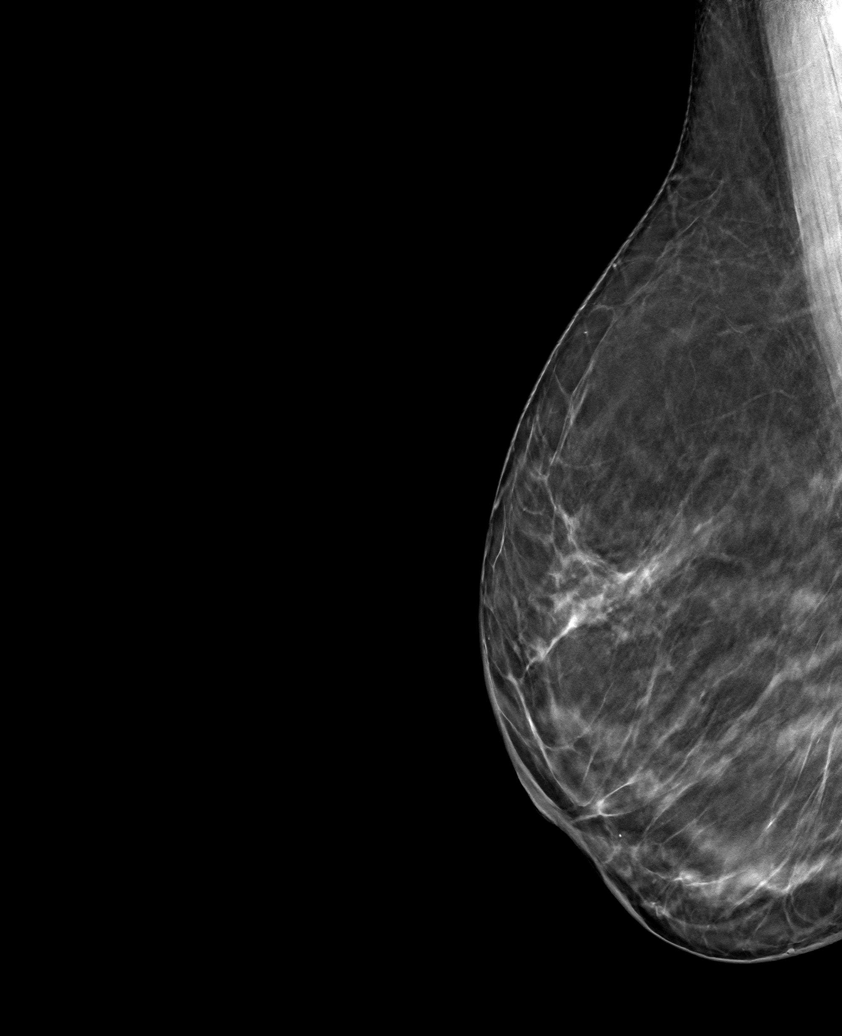

[R MLO tomo · tomo slice 31/62.0]
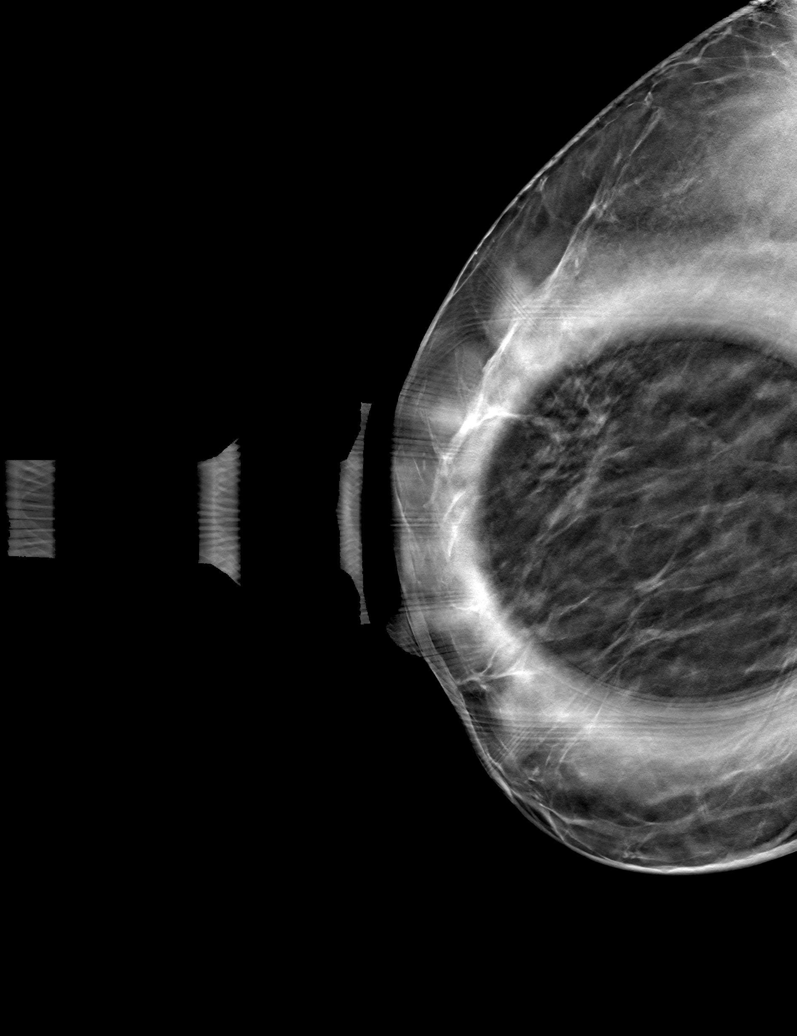

[6 of 18 positions shown; findings below may reference images not displayed]

ACR Breast Density Category c: The breast tissue is heterogeneously
dense, which may obscure small masses.
FINDINGS: Questioned distortion within the right breast resolved with
additional imaging compatible with dense overlapping fibroglandular
tissue. No suspicious findings on additional imaging.
IMPRESSION: No mammographic evidence for malignancy.

RECOMMENDATION:
Screening mammogram in one year.(Code:[id number])

I have discussed the findings and recommendations with the patient.
If applicable, a reminder letter will be sent to the patient
regarding the next appointment.

BI-RADS CATEGORY  1: Negative.

## 2023-04-14 ENCOUNTER — Other Ambulatory Visit: Payer: Self-pay

## 2023-04-14 ENCOUNTER — Emergency Department (HOSPITAL_BASED_OUTPATIENT_CLINIC_OR_DEPARTMENT_OTHER): Payer: No Typology Code available for payment source

## 2023-04-14 ENCOUNTER — Encounter (HOSPITAL_BASED_OUTPATIENT_CLINIC_OR_DEPARTMENT_OTHER): Payer: Self-pay

## 2023-04-14 ENCOUNTER — Emergency Department (HOSPITAL_BASED_OUTPATIENT_CLINIC_OR_DEPARTMENT_OTHER)
Admission: EM | Admit: 2023-04-14 | Discharge: 2023-04-14 | Disposition: A | Discharge status: Home / Self Care | Payer: No Typology Code available for payment source | Attending: Emergency Medicine | Admitting: Emergency Medicine

## 2023-04-14 DIAGNOSIS — R051 Acute cough: Secondary | ICD-10-CM | POA: Diagnosis not present

## 2023-04-14 DIAGNOSIS — I10 Essential (primary) hypertension: Secondary | ICD-10-CM | POA: Diagnosis not present

## 2023-04-14 DIAGNOSIS — M7989 Other specified soft tissue disorders: Secondary | ICD-10-CM | POA: Diagnosis not present

## 2023-04-14 DIAGNOSIS — R600 Localized edema: Secondary | ICD-10-CM

## 2023-04-14 MED ORDER — AMOXICILLIN 500 MG PO CAPS: 1000.0000 mg | ORAL_CAPSULE | Freq: Three times a day (TID) | ORAL | 0 refills | Status: AC

## 2023-04-14 MED ORDER — AZITHROMYCIN 250 MG PO TABS: 250.0000 mg | ORAL_TABLET | Freq: Every day | ORAL | 0 refills | Status: AC

## 2023-04-14 NOTE — Discharge Instructions (Signed)
Please read and follow all provided instructions.  Your diagnoses today include:  1. Peripheral edema   2. Acute cough     Tests performed today include: Chest x-ray: There was a question of early left lower lobe pneumonia, otherwise no problems Vital signs. See below for your results today.   Medications prescribed:  Amoxicillin - antibiotic  You have been prescribed an antibiotic medicine: take the entire course of medicine even if you are feeling better. Stopping early can cause the antibiotic not to work.  Azithromycin - antibiotic for respiratory infection  You have been prescribed an antibiotic medicine: take the entire course of medicine even if you are feeling better. Stopping early can cause the antibiotic not to work.  You may treat your cough with over-the-counter medications first and hold off on taking the antibiotics unless your breathing and cough worsens or you develop a fever.  Home care instructions:  Follow any educational materials contained in this packet.  BE VERY CAREFUL not to take multiple medicines containing Tylenol (also called acetaminophen). Doing so can lead to an overdose which can damage your liver and cause liver failure and possibly death.   Follow-up instructions: Please follow-up with your primary care provider in the next 5 days for further evaluation of your symptoms.   Return instructions:  Please return to the Emergency Department if you experience worsening symptoms.  Please return if you have any other emergent concerns.  Additional Information:  Your vital signs today were: BP 123/80   Pulse 83   Temp 97.7 F (36.5 C) (Oral)   Resp 17   Ht 5\' 4"  (1.626 m)   Wt 77.1 kg   LMP 05/23/2016 (Approximate)   SpO2 100%   BMI 29.18 kg/m  If your blood pressure (BP) was elevated above 135/85 this visit, please have this repeated by your doctor within one month. --------------

## 2023-04-14 NOTE — ED Triage Notes (Signed)
The patient stated last night her blood pressure was 140/107. She is having swelling to bil legs and a cough.

## 2023-04-14 NOTE — ED Provider Notes (Signed)
[facility] Provider Note   CSN: [id number] Arrival date & time: 04/14/23  1149     History  Chief Complaint  Patient presents with   Hypertension   Leg Swelling    Diamond Bennett is a 52 y.o. female.  Patient presents to the emergency department today for evaluation of hypertension and lower extremity edema.  Patient does not have history of hypertension and is not on medication but was told at her most recent PCP visit that her blood pressure was "slightly elevated".  Patient reports working long hours recently and being under a lot of stress from her job.  She is only been sleeping about 3 hours per night.  She sits in a chair for long periods of time.  She has noted bilateral lower extremity swelling in her feet and ankles, improved with elevation.  It is slightly improved after sleeping and even while waiting for a couple of hours in the ED waiting room.  She has had a cough over the past 1 week without URI symptoms.  No decreased exercise tolerance.  She denies history of liver problems, kidney problems or heart failure.  She states that recent lab work was performed through OB/GYN and she was told that she was "prediabetic" but otherwise there were no concerns.  This was done in the past several weeks.       Home Medications Prior to Admission medications   Medication Sig Start Date End Date Taking? Authorizing Provider  ALAYCEN 1/35 tablet Take 1 tablet by mouth daily. 05/03/16   [provider]  ALPRAZolam Prudy Feeler) 0.5 MG tablet Take 0.5 mg by mouth at bedtime as needed for anxiety or sleep.  03/28/16   [provider]  docusate sodium (COLACE) 100 MG capsule Take 1 capsule (100 mg total) by mouth 2 (two) times daily as needed for mild constipation. 06/22/16   Essie Hart, MD  ferrous sulfate 300 (60 Fe) MG/5ML syrup Take 5 mLs (300 mg total) by mouth 2 (two) times daily with a meal. 06/22/16 07/12/16  Essie Hart, MD   folic acid (FOLVITE) 1 MG tablet Take 1 tablet (1 mg total) by mouth daily. 04/12/16   Erenest Blank, NP  ibuprofen (ADVIL,MOTRIN) 800 MG tablet Take 1 tablet (800 mg total) by mouth every 8 (eight) hours as needed. 06/22/16   Essie Hart, MD  mometasone-formoterol (DULERA) 100-5 MCG/ACT AERO Inhale 2 puffs into the lungs 2 (two) times daily.    [provider]  montelukast (SINGULAIR) 10 MG tablet Take 10 mg by mouth at bedtime.  03/28/16 03/28/17  [provider]  omeprazole (PRILOSEC) 20 MG capsule Take 20 mg by mouth 2 (two) times daily before a meal.  03/28/16 03/28/17  [provider]  oxyCODONE-acetaminophen (PERCOCET/ROXICET) 5-325 MG tablet Take 1-2 tablets by mouth every 4 (four) hours as needed for severe pain. 06/22/16   Essie Hart, MD  predniSONE (DELTASONE) 10 MG tablet Take 10-14 mg by mouth See admin instructions. Tapered dose    [provider]      Allergies    Morphine and codeine    Review of Systems   Review of Systems  Physical Exam Updated Vital Signs BP (!) 135/94 (BP Location: Left Arm)   Pulse (!) 105   Temp 97.7 F (36.5 C) (Oral)   Resp 17   Ht 5\' 4"  (1.626 m)   Wt 77.1 kg   LMP 05/23/2016 (Approximate)   SpO2 99%  BMI 29.18 kg/m  Physical Exam Vitals and nursing note reviewed.  Constitutional:      General: She is not in acute distress.    Appearance: She is well-developed.  HENT:     Head: Normocephalic and atraumatic.     Right Ear: External ear normal.     Left Ear: External ear normal.     Nose: Nose normal.  Eyes:     Conjunctiva/sclera: Conjunctivae normal.  Cardiovascular:     Rate and Rhythm: Normal rate and regular rhythm.     Heart sounds: No murmur heard. Pulmonary:     Effort: No respiratory distress.     Breath sounds: Rales (mild, base) present. No wheezing or rhonchi.  Abdominal:     Palpations: Abdomen is soft.     Tenderness: There is no abdominal tenderness. There is no guarding or rebound.   Musculoskeletal:     Cervical back: Normal range of motion and neck supple.     Right lower leg: Edema present.     Left lower leg: Edema present.     Comments: 1+ pitting edema, symmetric and bilateral of the ankles.  Skin:    General: Skin is warm and dry.     Findings: No rash.  Neurological:     General: No focal deficit present.     Mental Status: She is alert. Mental status is at baseline.     Motor: No weakness.  Psychiatric:        Mood and Affect: Mood normal.     ED Results / Procedures / Treatments   Labs (all labs ordered are listed, but only abnormal results are displayed) Labs Reviewed - No data to display  EKG None  Radiology DG Chest 2 View  Result Date: 04/14/2023 CLINICAL DATA:  Cough.  Leg swelling. EXAM: CHEST - 2 VIEW COMPARISON:  None Available. FINDINGS: Cardiac silhouette and mediastinal contours are within normal limits. Mild left basilar interstitial thickening on frontal view. The right lung is clear. No pleural effusion pneumothorax. No acute skeletal abnormality. IMPRESSION: Mild left basilar interstitial thickening on frontal view, possible atelectasis or scarring. Early pneumonia is difficult to exclude. Electronically Signed   By: Neita Garnet M.D.   On: 04/14/2023 14:53    Procedures Procedures    Medications Ordered in ED Medications - No data to display  ED Course/ Medical Decision Making/ A&P    Patient seen and examined. History obtained directly from patient.   Labs/EKG: None ordered  Imaging: Ordered chest x-ray.  Medications/Fluids: None ordered  Most recent vital signs reviewed and are as follows: BP (!) 135/94 (BP Location: Left Arm)   Pulse (!) 105   Temp 97.7 F (36.5 C) (Oral)   Resp 17   Ht 5\' 4"  (1.626 m)   Wt 77.1 kg   LMP 05/23/2016 (Approximate)   SpO2 99%   BMI 29.18 kg/m   Initial impression: Bilateral lower extremity edema, cough  3:22 PM Reassessment performed. Patient appears stable.  Imaging  personally visualized and interpreted including: X-ray, agree questionable left lower lobe scarring versus atelectasis versus pneumonia.  Reviewed pertinent lab work and imaging with patient at bedside. Questions answered.   Most current vital signs reviewed and are as follows: BP 123/80   Pulse 83   Temp 97.7 F (36.5 C) (Oral)   Resp 17   Ht 5\' 4"  (1.626 m)   Wt 77.1 kg   LMP 05/23/2016 (Approximate)   SpO2 100%   BMI 29.18 kg/m  Plan: Discharge to home.   Prescriptions written for: Azithromycin and amoxicillin  Other home care instructions discussed: OTC meds, rest.  Patient will monitor symptoms and if worsening over the weekend, will initiate antibiotic therapy for pneumonia.  ED return instructions discussed: Return with fever, worsening shortness of breath, new symptoms or other concerns.  Follow-up instructions discussed: Patient encouraged to follow-up with their PCP in 5 days.                                Medical Decision Making Amount and/or Complexity of Data Reviewed Radiology: ordered.   Lower extremity edema: Do not suspect acute liver, heart, kidney insufficiency.  Suspect venous insufficiency.  Patient has been working long hours recently.  Clinically low concern for DVT/PE.  Hypertension: In setting of recent stressor, near normal here in the emergency room.  Would not start antihypertensives at this time.  Cough: Patient with some mild basilar Rales on exam, question of left basilar pneumonia on chest x-ray.  Patient will be prescribed antibiotics but treat with over-the-counter medications unless symptoms worsen and then she would fill the antibiotic.  The patient's vital signs, pertinent lab work and imaging were reviewed and interpreted as discussed in the ED course. Hospitalization was considered for further testing, treatments, or serial exams/observation. However as patient is well-appearing, has a stable exam, and reassuring studies today, I do  not feel that they warrant admission at this time. This plan was discussed with the patient who verbalizes agreement and comfort with this plan and seems reliable and able to return to the Emergency Department with worsening or changing symptoms.          Final Clinical Impression(s) / ED Diagnoses Final diagnoses:  Peripheral edema  Acute cough    Rx / DC Orders ED Discharge Orders          Ordered    amoxicillin (AMOXIL) 500 MG capsule  3 times daily        04/14/23 1519    azithromycin (ZITHROMAX) 250 MG tablet  Daily        04/14/23 1519              Renne Crigler, PA-C 04/14/23 1523    Horton, Clabe Seal, DO 04/15/23 [phone]

## 2023-04-14 NOTE — ED Notes (Signed)
D/c paperwork reviewed with pt, including prescriptions and follow up care.  No questions or concerns voiced at time of d/c. . Pt verbalized understanding, Ambulatory without assistance to ED exit, NAD.   

## 2023-11-13 ENCOUNTER — Encounter (HOSPITAL_BASED_OUTPATIENT_CLINIC_OR_DEPARTMENT_OTHER): Payer: Self-pay | Admitting: Emergency Medicine

## 2023-11-13 ENCOUNTER — Other Ambulatory Visit: Payer: Self-pay

## 2023-11-13 DIAGNOSIS — J45909 Unspecified asthma, uncomplicated: Secondary | ICD-10-CM | POA: Insufficient documentation

## 2023-11-13 DIAGNOSIS — L0201 Cutaneous abscess of face: Secondary | ICD-10-CM | POA: Diagnosis not present

## 2023-11-13 DIAGNOSIS — R220 Localized swelling, mass and lump, head: Secondary | ICD-10-CM | POA: Diagnosis present

## 2023-11-13 DIAGNOSIS — L03211 Cellulitis of face: Secondary | ICD-10-CM | POA: Insufficient documentation

## 2023-11-13 LAB — COMPREHENSIVE METABOLIC PANEL
ALT: 17 U/L (ref 0–44)
AST: 16 U/L (ref 15–41)
Albumin: 4.1 g/dL (ref 3.5–5.0)
Alkaline Phosphatase: 88 U/L (ref 38–126)
Anion gap: 10 (ref 5–15)
BUN: 12 mg/dL (ref 6–20)
CO2: 24 mmol/L (ref 22–32)
Calcium: 9.6 mg/dL (ref 8.9–10.3)
Chloride: 103 mmol/L (ref 98–111)
Creatinine, Ser: 0.79 mg/dL (ref 0.44–1.00)
GFR, Estimated: >60 mL/min (ref >60)
Glucose, Bld: 150 mg/dL — ABNORMAL HIGH (ref 70–99)
Potassium: 3.9 mmol/L (ref 3.5–5.1)
Sodium: 137 mmol/L (ref 135–145)
Total Bilirubin: 0.6 mg/dL (ref <1.2)
Total Protein: 7.3 g/dL (ref 6.5–8.1)

## 2023-11-13 LAB — CBC WITH DIFFERENTIAL/PLATELET
Abs Immature Granulocytes: 0.07 10*3/uL (ref 0.00–0.07)
Basophils Absolute: 0 10*3/uL (ref 0.0–0.1)
Basophils Relative: 0 %
Eosinophils Absolute: 0.1 10*3/uL (ref 0.0–0.5)
Eosinophils Relative: 1 %
HCT: 41 % (ref 36.0–46.0)
Hemoglobin: 13.3 g/dL (ref 12.0–15.0)
Immature Granulocytes: 1 %
Lymphocytes Relative: 12 %
Lymphs Abs: 1.5 10*3/uL (ref 0.7–4.0)
MCH: 26.2 pg (ref 26.0–34.0)
MCHC: 32.4 g/dL (ref 30.0–36.0)
MCV: 80.7 fL (ref 80.0–100.0)
Monocytes Absolute: 0.5 10*3/uL (ref 0.1–1.0)
Monocytes Relative: 4 %
Neutro Abs: 10.2 10*3/uL — ABNORMAL HIGH (ref 1.7–7.7)
Neutrophils Relative %: 82 %
Platelets: 233 10*3/uL (ref 150–400)
RBC: 5.08 MIL/uL (ref 3.87–5.11)
RDW: 13.8 % (ref 11.5–15.5)
WBC: 12.4 10*3/uL — ABNORMAL HIGH (ref 4.0–10.5)
nRBC: 0 % (ref 0.0–0.2)

## 2023-11-13 LAB — LACTIC ACID, PLASMA: Lactic Acid, Venous: 0.7 mmol/L (ref 0.5–1.9)

## 2023-11-13 NOTE — ED Triage Notes (Signed)
Right side lip and facial swelling since Friday, pt state had used a razor while lips were chapped and may have been the cause. See at [facility] on Friday, was given Rx, not getting better.

## 2023-11-14 ENCOUNTER — Emergency Department (HOSPITAL_BASED_OUTPATIENT_CLINIC_OR_DEPARTMENT_OTHER)
Admission: EM | Admit: 2023-11-14 | Discharge: 2023-11-14 | Disposition: A | Discharge status: Home / Self Care | Payer: No Typology Code available for payment source | Attending: Emergency Medicine | Admitting: Emergency Medicine

## 2023-11-14 DIAGNOSIS — L0201 Cutaneous abscess of face: Secondary | ICD-10-CM

## 2023-11-14 DIAGNOSIS — L03211 Cellulitis of face: Secondary | ICD-10-CM

## 2023-11-14 MED ORDER — CLINDAMYCIN HCL 150 MG PO CAPS
450.0000 mg | ORAL_CAPSULE | Freq: Once | ORAL | Status: AC
Start: 2023-11-14 — End: 2023-11-14
  Administered 2023-11-14: 450 mg via ORAL
  Filled 2023-11-14: qty 3

## 2023-11-14 MED ORDER — CLINDAMYCIN HCL 150 MG PO CAPS: 450.0000 mg | ORAL_CAPSULE | Freq: Three times a day (TID) | ORAL | 0 refills | Status: AC

## 2023-11-14 MED ORDER — BUPIVACAINE-EPINEPHRINE (PF) 0.5% -1:200000 IJ SOLN
1.8000 mL | Freq: Once | INTRAMUSCULAR | Status: AC
  Administered 2023-11-14: 1.8 mL

## 2023-11-14 MED ORDER — BUPIVACAINE-EPINEPHRINE (PF) 0.5% -1:200000 IJ SOLN: 1.8000 mL | Freq: Once | INTRAMUSCULAR | Status: DC

## 2023-11-14 NOTE — ED Provider Notes (Signed)
[place] [facility]PARTMENT AT [facility] Provider Note  CSN: [id number] Arrival date & time: 11/13/23 2312  Chief Complaint(s) Oral Swelling and Facial Swelling  HPI Diamond Bennett is a 52 y.o. female with a past medical history listed below who presents to the emergency department with right facial swelling and pain.  Patient was diagnosed with cellulitis by urgent care and placed on Augmentin and a valacyclovir.  Patient has been on the medicine for 4 days with no relief.  Swelling and pain is worsening.  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 Authorizing Provider  clindamycin (CLEOCIN) 150 MG capsule Take 3 capsules (450 mg total) by mouth 3 (three) times daily for 7 days. 11/14/23 11/21/23 Yes Neoma Uhrich, Amadeo Garnet, MD  ALAYCEN 1/35 tablet Take 1 tablet by mouth daily. 05/03/16   [provider]  ALPRAZolam Prudy Feeler) 0.5 MG tablet Take 0.5 mg by mouth at bedtime as needed for anxiety or sleep.  03/28/16   [provider]  azithromycin (ZITHROMAX) 250 MG tablet Take 1 tablet (250 mg total) by mouth daily. Take first 2 tablets together, then 1 every day until finished. 04/14/23    Renne Crigler, PA-C  docusate sodium (COLACE) 100 MG capsule Take 1 capsule (100 mg total) by mouth 2 (two) times daily as needed for mild constipation. 06/22/16   Essie Hart, MD  ferrous sulfate 300 (60 Fe) MG/5ML syrup Take 5 mLs (300 mg total) by mouth 2 (two) times daily with a meal. 06/22/16 07/12/16  Essie Hart, MD  folic acid (FOLVITE) 1 MG tablet Take 1 tablet (1 mg total) by mouth daily. 04/12/16   Erenest Blank, NP  ibuprofen (ADVIL,MOTRIN) 800 MG tablet Take 1 tablet (800 mg total) by mouth every 8 (eight) hours as needed. 06/22/16   Essie Hart, MD  mometasone-formoterol (DULERA) 100-5 MCG/ACT AERO Inhale 2 puffs into the lungs 2 (two) times daily.    [provider]  montelukast (SINGULAIR) 10 MG tablet Take 10 mg by mouth at bedtime.  03/28/16 03/28/17  [provider]  omeprazole (PRILOSEC) 20 MG capsule Take 20 mg by mouth 2 (two) times daily before a meal.  03/28/16 03/28/17  [provider]  oxyCODONE-acetaminophen (PERCOCET/ROXICET) 5-325 MG tablet Take 1-2 tablets by mouth every 4 (four) hours as needed for severe pain. 06/22/16   Essie Hart, MD  predniSONE (DELTASONE) 10 MG tablet Take 10-14 mg by mouth See admin instructions. Tapered dose    [provider]  Allergies Morphine and codeine  Review of Systems Review of Systems As noted in HPI  Physical Exam Vital Signs  I have reviewed the triage vital signs BP (!) 133/90 (BP Location: Right Arm)   Pulse (!) 106   Temp 97.8 F (36.6 C)   Resp 18   Ht 5\' 4"  (1.626 m)   Wt 77.6 kg   LMP 05/23/2016 (Approximate)   SpO2 97%   BMI 29.35 kg/m   Physical Exam Vitals reviewed.  Constitutional:      General: She is not in acute distress.    Appearance: She is well-developed. She is not diaphoretic.  HENT:     Head: Normocephalic and atraumatic.     Right Ear:  External ear normal.     Left Ear: External ear normal.     Nose: Nose normal.     Mouth/Throat:   Eyes:     General: No scleral icterus.    Conjunctiva/sclera: Conjunctivae normal.  Neck:     Trachea: Phonation normal.  Cardiovascular:     Rate and Rhythm: Normal rate and regular rhythm.  Pulmonary:     Effort: Pulmonary effort is normal. No respiratory distress.     Breath sounds: No stridor.  Abdominal:     General: There is no distension.  Musculoskeletal:        General: Normal range of motion.     Cervical back: Normal range of motion.  Lymphadenopathy:     Head:     Right side of head: Submental and submandibular adenopathy present.  Neurological:     Mental Status: She is alert and oriented to person, place, and time.  Psychiatric:        Behavior: Behavior normal.     ED Results and Treatments Labs (all labs ordered are listed, but only abnormal results are displayed) Labs Reviewed  COMPREHENSIVE METABOLIC PANEL - Abnormal; Notable for the following components:      Result Value   Glucose, Bld 150 (*)    All other components within normal limits  CBC WITH DIFFERENTIAL/PLATELET - Abnormal; Notable for the following components:   WBC 12.4 (*)    Neutro Abs 10.2 (*)    All other components within normal limits  LACTIC ACID, PLASMA                                                                                                                         EKG  EKG Interpretation Date/Time:    Ventricular Rate:    PR Interval:    QRS Duration:    QT Interval:    QTC Calculation:   R Axis:      Text Interpretation:         Radiology No results found.  Medications Ordered in ED Medications  bupivacaine-epinephrine (PF) (MARCAINE W/ EPI) 0.5% -1:200000 injection 1.8 mL (1.8 mLs Infiltration Given 11/14/23 0222)  clindamycin (CLEOCIN) capsule 450 mg (450 mg Oral Given 11/14/23 0232)   Procedures .Incision and  Drainage  Date/Time: 11/14/2023 2:31  AM  Performed by: Nira Conn, MD Authorized by: Nira Conn, MD   Consent:    Consent obtained:  Verbal   Consent given by:  Patient   Risks discussed:  Bleeding, incomplete drainage and infection   Alternatives discussed:  No treatment Universal protocol:    Patient identity confirmed:  Arm band Location:    Type:  Abscess   Size:  1cm   Location:  Head   Head location:  Face Pre-procedure details:    Skin preparation:  Povidone-iodine Anesthesia:    Anesthesia method:  Nerve block Procedure type:    Complexity:  Simple Procedure details:    Ultrasound guidance: yes     Incision types:  Stab incision   Incision depth:  Subcutaneous   Wound management:  Probed and deloculated   Drainage:  Purulent and bloody   Drainage amount:  Scant   Packing materials:  None Post-procedure details:    Procedure completion:  Tolerated .Nerve Block  Date/Time: 11/14/2023 2:32 AM  Performed by: Nira Conn, MD Authorized by: Nira Conn, MD   Consent:    Risks discussed:  Allergic reaction, infection, nerve damage, pain, intravenous injection and unsuccessful block Universal protocol:    Patient identity confirmed:  Arm band Location:    Body area:  Head   Head nerve blocked: infraorbital.   Laterality:  Right Pre-procedure details:    Skin preparation:  Povidone-iodine   Preparation: Patient was prepped and draped in usual sterile fashion   Procedure details:    Block needle gauge:  27 G   Anesthetic injected:  Bupivacaine 0.5% w/o epi   Additive injected:  None   Injection procedure:  Anatomic landmarks identified, incremental injection, negative aspiration for blood, introduced needle and anatomic landmarks palpated   Paresthesia:  None Post-procedure details:    Outcome:  Anesthesia achieved   Procedure completion:  Tolerated .Ultrasound ED Soft Tissue  Date/Time: 11/14/2023 2:35 AM  Performed by: Nira Conn,  MD Authorized by: Nira Conn, MD   Procedure details:    Indications: localization of abscess     Transverse view:  Visualized   Longitudinal view:  Visualized   Images: archived   Location:    Location: face     Side:  Right Findings:     abscess present    cellulitis present   (including critical care time) Medical Decision Making / ED Course   Medical Decision Making Amount and/or Complexity of Data Reviewed Labs: ordered. Decision-making details documented in ED Course.  Risk Prescription drug management.    Confirmed abscess with superimposed cellulitis of the right upper lip by ultrasound.  I&D as above. Given no improvement with Augmentin, will change to clindamycin. 1st dose given in the ER.    Final Clinical Impression(s) / ED Diagnoses Final diagnoses:  Abscess of face  Facial cellulitis   The patient appears reasonably screened and/or stabilized for discharge and I doubt any other medical condition or other [facility] requiring further screening, evaluation, or treatment in the ED at this time. I have discussed the findings, Dx and Tx plan with the patient/family who expressed understanding and agree(s) with the plan. Discharge instructions discussed at length. The patient/family was given strict return precautions who verbalized understanding of the instructions. No further questions at time of discharge.  Disposition: Discharge  Condition: Good  ED Discharge Orders          Ordered  clindamycin (CLEOCIN) 150 MG capsule  3 times daily        11/14/23 0236            Follow Up: Jamal Collin, PA-C [address] [place] [address] [phone]  Call  to schedule an appointment for close follow up    This chart was dictated using voice recognition software.  Despite best efforts to proofread,  errors can occur which can change the documentation meaning.    Nira Conn, MD 11/14/23 [phone]

## 2023-11-14 NOTE — Discharge Instructions (Addendum)
Thank you for allowing [place] to take care of you today.  We hope you begin feeling better soon.  To-Do: Please follow-up with your primary doctor. Stop taking Augmentin and start taking Clindamycin for face infection. Please return to the Emergency Department or call 911 if you experience chest pain, shortness of breath, severe pain, severe fever, altered mental status, or have any reason to think that you need emergency medical care.  Thank you again.  Hope you feel better soon.  Department of Emergency Medicine [facility]
